# Patient Record
Sex: Female | Born: 1980 | Race: Asian | Hispanic: No | Marital: Single | Smoking: Never smoker
Health system: Southern US, Community
[De-identification: ages and names within clinical notes are randomized; demographics above are authoritative.]

## PROBLEM LIST (undated history)

## (undated) DIAGNOSIS — O133 Gestational [pregnancy-induced] hypertension without significant proteinuria, third trimester: Secondary | ICD-10-CM

## (undated) DIAGNOSIS — Z8759 Personal history of other complications of pregnancy, childbirth and the puerperium: Secondary | ICD-10-CM

## (undated) DIAGNOSIS — O139 Gestational [pregnancy-induced] hypertension without significant proteinuria, unspecified trimester: Secondary | ICD-10-CM

## (undated) HISTORY — DX: Gestational (pregnancy-induced) hypertension without significant proteinuria, unspecified trimester: O13.9

---

## 2014-11-22 ENCOUNTER — Ambulatory Visit (INDEPENDENT_AMBULATORY_CARE_PROVIDER_SITE_OTHER): Payer: PRIVATE HEALTH INSURANCE | Admitting: Family Medicine

## 2014-11-22 VITALS — BP 100/68 | HR 80 | Temp 98.4°F | Resp 16 | Ht 61.5 in | Wt 134.2 lb

## 2014-11-22 DIAGNOSIS — K5901 Slow transit constipation: Secondary | ICD-10-CM | POA: Diagnosis not present

## 2014-11-22 DIAGNOSIS — K648 Other hemorrhoids: Secondary | ICD-10-CM | POA: Diagnosis not present

## 2014-11-22 DIAGNOSIS — Z789 Other specified health status: Secondary | ICD-10-CM | POA: Diagnosis not present

## 2014-11-22 MED ORDER — HYDROCORTISONE ACE-PRAMOXINE 2.5-1 % RE CREA: 1.0000 "application " | TOPICAL_CREAM | Freq: Three times a day (TID) | RECTAL | Status: DC

## 2014-11-22 NOTE — Patient Instructions (Signed)
Use Analpram-HC cream 2.5% 2 or 3 times daily for hemorrhoids  Drink lots of water  Eat more fruits and vegetables  Take MiraLAX one dose daily for constipation. If necessary take one dose twice daily. If stools become too loose decrease to taking every other day.  If this does not get the bowels moving, you can purchase a bottle of magnesium citrate laxitive and drink one bottle.  If not improving in the next 2 weeks please return for a recheck. You can call the office and ask what they Dr. Alwyn RenHopper is working and if you come in when I'm in the office and request me I'll be happy to see you.  Return at any time if having abdominal pain or acutely worse.  To bn (Constipation) To bn l khi m?t ng??i ?i ??i ti?n t h?n ba l?n trong m?t tu?n, ??i ti?n kh kh?n, ho?c ??i ti?n ra phn kh, c?ng, ho?c to h?n bnh th??ng. Khi tu?i cng cao th cng d? b? to bn. N?u qu v? c? g?ng ch?a to bn b?ng cc lo?i thu?c gip qu v? ??i ti?n ???c (thu?c nhu?n trng), b?nh c th? n?ng thm. S? d?ng thu?c nhu?n trng trong th?i gian di c th? lm cho cc c? c?a ru?t gi y?u ?i. Ch? ?? ?n thi?u ch?t x?, khng u?ng ?? n??c, v dng m?t s? lo?i thu?c nh?t ??nh c th? lm to bo n?ng thm.  NGUYN NHN.   M?t s? lo?i thu?c nh?t ??nh nh? thu?c ch?ng tr?m c?m, thu?c gi?m ?au, thu?c c b? sung s?t, thu?c trung ha axit d?ch v?, v thu?c l?i ti?u.  M?t s? b?nh l nh?t ??nh nh? ti?u ???ng, h?i ch?ng ru?t kch thch (IBS), b?nh c?a tuy?n gip, ho?c tr?m c?m.  Khng u?ng ?? n??c.  Khng ?n ?? th?c ?n giu ch?t x?.  C?ng th?ng ho?c do ?i l?i.  t ho?t ??ng thn th? ho?c th? d?c.  Nh?n ?i ??i ti?n.  S? d?ng qu nhi?u thu?c nhu?n trng. D?U HI?U V TRI?U CH?NG   ?i ??i ti?n t h?n ba l?n m?i tu?n.  Ph?i r?n m?nh ?? ??i ti?n.  ??i ti?n ra phn c?ng, kh, ho?c to h?n bnh th??ng.  C?m th?y ??y b?ng ho?c ch??ng b?ng.  ?au ? vng b?ng d??i.  Khng c?m th?y tho?i mi sau khi ??i ti?n. CH?N ?ON   Chuyn gia ch?m [place] s?c kh?e c?a qu v? s? h?i v? b?nh s? v khm th?c th? cho qu v?. C th? c?n ki?m tra thm trong tr??ng h?p to bn n?ng. M?t s? ki?m tra c th? bao g?m:  Ch?p X quang c ch?t c?n quang ?? ki?m tra tr?c trng, ??i trng, v ?i khi c? ru?t non c?a qu v?.  N?i soi tr?c trng sigma ?? ki?m tra ph?n pha d??i c?a ??i trng.  Th? thu?t soi ??i trng ?? khm ton b? ??i trng. ?I?U TR?  ?i?u tr? ty thu?c [place] m?c ?? tr?m tr?ng c?a ch?ng to bn v nguyn nhn gy to bn. ?i?u tr? thng qua ch? ?? ?n u?ng bao g?m u?ng nhi?u n??c h?n v ?n th?c ?n c nhi?u ch?t x? h?n. ?i?u tr? thng qua l?i s?ng c th? bao g?m vi?c t?p th? d?c th??ng xuyn. N?u nh?ng khuy?n ngh? v? ch? ?? ?n v l?i s?ng khng c tc d?ng, chuyn gia ch?m [place] s?c kh?e c th? khuy?n ngh? qu v? dng cc lo?i thu?c nhu?n trng khng c?n k ??n ?? gip  qu v? ??i ti?n. C th? ph?i k ??n thu?c c?n k ??n n?u thu?c khng c?n k ??n khng c tc d?ng.  H??NG D?N CH?M [place] T?I NH   ?n th?c ?n c nhi?u ch?t x? nh? tri cy, rau, ng? c?c nguyn h?t, v cc lo?i ??u.  H?n ch? th?c ?n c nhi?u ch?t bo v ???ng ch? bi?n s?n, ch?ng h?n khoai ty chin, bnh hamburger, bnh quy, k?o, v soda.  C th? dng th?c ph?m ch?c n?ng c b? sung ch?t x? [place] kh?u ph?n ?n c?a qu v? n?u qu v? khng th? dng ?? ch?t x? t? th?c ?n.  U?ng ?? n??c ?? gi? cho n??c ti?u trong ho?c vng nh?t.  T?p th? d?c th??ng xuyn ho?c theo ch? d?n c?a chuyn gia ch?m [place] s?c kh?e.  [place] nh v? sinh ngay khi qu v? c nhu c?u. Khng nh?n ?i ??i ti?n.  Ch? s? d?ng thu?c khng c?n k ??n ho?c thu?c c?n k ??n theo ch? d?n c?a chuyn gia ch?m [place] s?c kh?e.Khng dng cc lo?i thu?c ch?ng to bn khc m khng bn b?c tr??c v?i chuyn gia ch?m [place] s?c kh?e. NGAY L?P T?C ?I KHM N?U:   ?i ??i ti?n ra mu ?? t??i.  Ch?ng to bn ko di h?n 4 ngy v tr?m tr?ng h?n.  Qu v? b? ?au b?ng ho?c ?au tr?c trng.  Phn c?a qu v? m?ng, trng nh? bt  ch.  Qu v? b? s?t cn khng r nguyn nhn. ??M B?O QU V?:   Hi?u r cc h??ng d?n ny.  S? theo di tnh tr?ng c?a mnh.  S? yu c?u tr? gip ngay l?p t?c n?u qu v? c?m th?y khng kh?e ho?c th?y tr?m tr?ng h?n. Document Released: 08/21/2010 Document Revised: 05/11/2013 [facility] Patient Information 2015 [place], [place]. This information is not intended to replace advice given to you by your health care provider. Make sure you discuss any questions you have with your health care provider.

## 2014-11-22 NOTE — Progress Notes (Signed)
Subjective:  Patient ID: Kathleen Bailey, female    DOB: Sep 20, 1980  Age: 34 y.o. MRN: [id number]  34 year old lady complaining of hemorrhoids. She has been in the [place] for 3 years, and was having hemorrhoid problems before she came here. She is single, has not had any children. She ate a lot of vegetables in [place] which did her better, but she doesn't like the ones here and has not eaten enough vegetables. She complains of the hemorrhoids coming out. Her aunt did help her purchased some Preparation H, which she has used though possibly not correctly. She still has bad constipation, with her bowels moving only about once a week. She works doing nails. She has been in the [place] for 3 years but has not learned hardly any [place]. She was encouraged to try to do so. Her aunt who is with her was able to easily interpret.  Reviewed past, family, social history Objective:  Healthy-appearing young lady in no major distress. Abdomen soft with some mild nonspecific tenderness. Anus appears grossly normal externally with no external hemorrhoids today. Digital exam reveals internal hemorrhoids palpable especially at about the 11:00 position. Anoscopy was not done.   Assessment & Plan:   Assessment: Internal hemorrhoids Constipation  Plan: Patient Instructions  Use Analpram-HC cream 2.5% 2 or 3 times daily for hemorrhoids  Drink lots of water  Eat more fruits and vegetables  Take MiraLAX one dose daily for constipation. If necessary take one dose twice daily. If stools become too loose decrease to taking every other day.  If this does not get the bowels moving, you can purchase a bottle of magnesium citrate laxitive and drink one bottle.  If not improving in the next 2 weeks please return for a recheck. You can call the office and ask what they Dr. Alwyn Ren is working and if you come in when I'm in the office and request me I'll be happy to see you.  Return at any time if having abdominal pain or  acutely worse.  To bn (Constipation) To bn l khi m?t ng??i ?i ??i ti?n t h?n ba l?n trong m?t tu?n, ??i ti?n kh kh?n, ho?c ??i ti?n ra phn kh, c?ng, ho?c to h?n bnh th??ng. Khi tu?i cng cao th cng d? b? to bn. N?u qu v? c? g?ng ch?a to bn b?ng cc lo?i thu?c gip qu v? ??i ti?n ???c (thu?c nhu?n trng), b?nh c th? n?ng thm. S? d?ng thu?c nhu?n trng trong th?i gian di c th? lm cho cc c? c?a ru?t gi y?u ?i. Ch? ?? ?n thi?u ch?t x?, khng u?ng ?? n??c, v dng m?t s? lo?i thu?c nh?t ??nh c th? lm to bo n?ng thm.  NGUYN NHN.   M?t s? lo?i thu?c nh?t ??nh nh? thu?c ch?ng tr?m c?m, thu?c gi?m ?au, thu?c c b? sung s?t, thu?c trung ha axit d?ch v?, v thu?c l?i ti?u.  M?t s? b?nh l nh?t ??nh nh? ti?u ???ng, h?i ch?ng ru?t kch thch (IBS), b?nh c?a tuy?n gip, ho?c tr?m c?m.  Khng u?ng ?? n??c.  Khng ?n ?? th?c ?n giu ch?t x?.  C?ng th?ng ho?c do ?i l?i.  t ho?t ??ng thn th? ho?c th? d?c.  Nh?n ?i ??i ti?n.  S? d?ng qu nhi?u thu?c nhu?n trng. D?U HI?U V TRI?U CH?NG   ?i ??i ti?n t h?n ba l?n m?i tu?n.  Ph?i r?n m?nh ?? ??i ti?n.  ??i ti?n ra phn c?ng, kh, ho?c to h?n bnh th??ng.  C?m th?y ??y b?ng ho?c ch??ng b?ng.  ?au ? vng b?ng d??i.  Khng c?m th?y tho?i mi sau khi ??i ti?n. CH?N ?ON  Chuyn gia ch?m [place] s?c kh?e c?a qu v? s? h?i v? b?nh s? v khm th?c th? cho qu v?. C th? c?n ki?m tra thm trong tr??ng h?p to bn n?ng. M?t s? ki?m tra c th? bao g?m:  Ch?p X quang c ch?t c?n quang ?? ki?m tra tr?c trng, ??i trng, v ?i khi c? ru?t non c?a qu v?.  N?i soi tr?c trng sigma ?? ki?m tra ph?n pha d??i c?a ??i trng.  Th? thu?t soi ??i trng ?? khm ton b? ??i trng. ?I?U TR?  ?i?u tr? ty thu?c [place] m?c ?? tr?m tr?ng c?a ch?ng to bn v nguyn nhn gy to bn. ?i?u tr? thng qua ch? ?? ?n u?ng bao g?m u?ng nhi?u n??c h?n v ?n th?c ?n c nhi?u ch?t x? h?n. ?i?u tr? thng qua l?i s?ng c th? bao g?m vi?c t?p th? d?c  th??ng xuyn. N?u nh?ng khuy?n ngh? v? ch? ?? ?n v l?i s?ng khng c tc d?ng, chuyn gia ch?m [place] s?c kh?e c th? khuy?n ngh? qu v? dng cc lo?i thu?c nhu?n trng khng c?n k ??n ?? gip qu v? ??i ti?n. C th? ph?i k ??n thu?c c?n k ??n n?u thu?c khng c?n k ??n khng c tc d?ng.  H??NG D?N CH?M [place] T?I NH   ?n th?c ?n c nhi?u ch?t x? nh? tri cy, rau, ng? c?c nguyn h?t, v cc lo?i ??u.  H?n ch? th?c ?n c nhi?u ch?t bo v ???ng ch? bi?n s?n, ch?ng h?n khoai ty chin, bnh hamburger, bnh quy, k?o, v soda.  C th? dng th?c ph?m ch?c n?ng c b? sung ch?t x? [place] kh?u ph?n ?n c?a qu v? n?u qu v? khng th? dng ?? ch?t x? t? th?c ?n.  U?ng ?? n??c ?? gi? cho n??c ti?u trong ho?c vng nh?t.  T?p th? d?c th??ng xuyn ho?c theo ch? d?n c?a chuyn gia ch?m [place] s?c kh?e.  [place] nh v? sinh ngay khi qu v? c nhu c?u. Khng nh?n ?i ??i ti?n.  Ch? s? d?ng thu?c khng c?n k ??n ho?c thu?c c?n k ??n theo ch? d?n c?a chuyn gia ch?m [place] s?c kh?e.Khng dng cc lo?i thu?c ch?ng to bn khc m khng bn b?c tr??c v?i chuyn gia ch?m [place] s?c kh?e. NGAY L?P T?C ?I KHM N?U:   ?i ??i ti?n ra mu ?? t??i.  Ch?ng to bn ko di h?n 4 ngy v tr?m tr?ng h?n.  Qu v? b? ?au b?ng ho?c ?au tr?c trng.  Phn c?a qu v? m?ng, trng nh? bt ch.  Qu v? b? s?t cn khng r nguyn nhn. ??M B?O QU V?:   Hi?u r cc h??ng d?n ny.  S? theo di tnh tr?ng c?a mnh.  S? yu c?u tr? gip ngay l?p t?c n?u qu v? c?m th?y khng kh?e ho?c th?y tr?m tr?ng h?n. Document Released: 08/21/2010 Document Revised: 05/11/2013 [facility] Patient Information 2015 [place], [place]. This information is not intended to replace advice given to you by your health care provider. Make sure you discuss any questions you have with your health care provider.      Marshell Rieger, MD 11/22/2014

## 2014-12-06 ENCOUNTER — Ambulatory Visit (INDEPENDENT_AMBULATORY_CARE_PROVIDER_SITE_OTHER): Payer: No Typology Code available for payment source | Admitting: Family Medicine

## 2014-12-06 VITALS — BP 128/72 | HR 90 | Temp 98.3°F | Resp 16 | Ht 61.0 in | Wt 137.4 lb

## 2014-12-06 DIAGNOSIS — K5901 Slow transit constipation: Secondary | ICD-10-CM | POA: Diagnosis not present

## 2014-12-06 DIAGNOSIS — K6289 Other specified diseases of anus and rectum: Secondary | ICD-10-CM | POA: Diagnosis not present

## 2014-12-06 DIAGNOSIS — K648 Other hemorrhoids: Secondary | ICD-10-CM

## 2014-12-06 NOTE — Progress Notes (Signed)
  Subjective:  Patient ID: Kathleen Bailey, female    DOB: 17-Nov-1980  Age: 34 y.o. MRN: [id number]  Patient is here for follow-up with regard to her anal pain. Still hurts up inside of her some. She is doing better. Her bowels are moving better with the MiraLAX. No bleeding. No nausea or vomiting. No major abdominal pain. She has an interpreter with her.   Objective:   Abdomen is soft. Anus appears normal. However when up hard she complains of pain. On digital exam I could feel an internal hemorrhoid at about the 1:00 position.  Procedure note: The endoscope was used to examine the anus. It was inserted without difficulty. Good exam was obtained. She had a small internal hemorrhoid but no other major lesions. She tolerated the procedure well. No significant inflammation.  Assessment & Plan:   Assessment: Anal pain Internal hemorrhoids Constipation, improved  Plan: There are no Patient Instructions on file for this visit.   HOPPER,DAVID, MD 12/06/2014

## 2014-12-06 NOTE — Patient Instructions (Signed)
Continue using the MiraLAX. If the bowels are doing well she can decrease the frequency or amount of the medicine she is using. However she should not let her self get constipated, and if she goes a couple of days without a bowel movement she should make sure she is using it.  Continue using hemorrhoid cream for a couple more weeks.  If problems continue to persist please return

## 2014-12-06 NOTE — Addendum Note (Signed)
Addended by: Maurene CapesPOTTS, Lain Tetterton M on: 12/06/2014 12:54 PM   Modules accepted: Level of Service

## 2015-02-15 ENCOUNTER — Telehealth: Payer: Self-pay

## 2015-02-15 NOTE — Telephone Encounter (Signed)
Patient would like a referral to a specialist who can evaluate and treat a bone spur on her heel.  Please advise, thank you.  CB#: [phone]

## 2015-02-16 NOTE — Telephone Encounter (Signed)
Pt has to been seen here first.

## 2015-02-17 NOTE — Telephone Encounter (Signed)
Left message letting pt know to RTC.

## 2015-02-20 ENCOUNTER — Ambulatory Visit (INDEPENDENT_AMBULATORY_CARE_PROVIDER_SITE_OTHER): Payer: No Typology Code available for payment source | Admitting: Family Medicine

## 2015-02-20 VITALS — BP 110/68 | HR 97 | Temp 98.0°F | Resp 16 | Ht 61.25 in | Wt 143.0 lb

## 2015-02-20 DIAGNOSIS — M79671 Pain in right foot: Secondary | ICD-10-CM

## 2015-02-20 DIAGNOSIS — E890 Postprocedural hypothyroidism: Secondary | ICD-10-CM

## 2015-02-20 DIAGNOSIS — Z789 Other specified health status: Secondary | ICD-10-CM | POA: Diagnosis not present

## 2015-02-20 DIAGNOSIS — M722 Plantar fascial fibromatosis: Secondary | ICD-10-CM | POA: Diagnosis not present

## 2015-02-20 DIAGNOSIS — G8929 Other chronic pain: Secondary | ICD-10-CM

## 2015-02-20 DIAGNOSIS — R1310 Dysphagia, unspecified: Secondary | ICD-10-CM | POA: Diagnosis not present

## 2015-02-20 DIAGNOSIS — Z9009 Acquired absence of other part of head and neck: Secondary | ICD-10-CM

## 2015-02-20 DIAGNOSIS — M542 Cervicalgia: Secondary | ICD-10-CM | POA: Diagnosis not present

## 2015-02-20 DIAGNOSIS — Z87898 Personal history of other specified conditions: Secondary | ICD-10-CM

## 2015-02-20 LAB — POCT CBC
Granulocyte percent: 73.7 %G (ref 37–80)
HCT, POC: 43.4 % (ref 37.7–47.9)
Hemoglobin: 13.3 g/dL (ref 12.2–16.2)
LYMPH, POC: 2.5 (ref 0.6–3.4)
MCH, POC: 26.7 pg — AB (ref 27–31.2)
MCHC: 30.6 g/dL — AB (ref 31.8–35.4)
MCV: 87.5 fL (ref 80–97)
MID (CBC): 0.3 (ref 0–0.9)
MPV: 8.6 fL (ref 0–99.8)
PLATELET COUNT, POC: 191 10*3/uL (ref 142–424)
POC Granulocyte: 8 — AB (ref 2–6.9)
POC LYMPH PERCENT: 23.2 %L (ref 10–50)
POC MID %: 3.1 % (ref 0–12)
RBC: 4.96 M/uL (ref 4.04–5.48)
RDW, POC: 13 %
WBC: 10.8 10*3/uL — AB (ref 4.6–10.2)

## 2015-02-20 MED ORDER — DICLOFENAC SODIUM 75 MG PO TBEC: 75.0000 mg | DELAYED_RELEASE_TABLET | Freq: Two times a day (BID) | ORAL | Status: DC

## 2015-02-20 NOTE — Progress Notes (Signed)
Patient ID: Kathleen Bailey, female    DOB: [birth year]  Age: 34 y.o. MRN: [id number]  Chief Complaint  Patient presents with  . Foot Pain    Pain in both heels onset 4 years on and off/ worse in right foot  . Throat swelling    After thyroidectomy in [place] 1 year ago    Subjective:   34 year old lady who does not speak [place], but has a [place] interpreter with her. She has a couple of things going on today. She was in [place] last year and had a thyroid surgery, with the thyroid removed apparently. She has persisted with having throat pain and pain with swallowing. She had an episode a few weeks ago of a high fever over 100. She is mostly concerned because of the discomfort that she has had there when she swallows. She did everything that the doctor told her to there.  Her other main concern is she she's been having a lot of trouble with pain in the right heel. This been going on for about a year, it is worse now. It hurts in the whole heel area, but especially at the base of the calcaneus. No known trauma. She usually wears flats and flip-flops at work. 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 When she gets up in the morning and her feet hit the floor but hurt considerably, especially right.  Current allergies, medications, problem list, past/family and social histories reviewed.  Objective:  BP 110/68 mmHg  Pulse 97  Temp(Src) 98 F (36.7 C) (Oral)  Resp 16  Ht 5' 1.25" (1.556 m)  Wt 143 lb (64.864 kg)  BMI 26.79 kg/m2  SpO2 97%  LMP 01/23/2015  No major acute distress. Very faint scar is visible from the thyroidectomy. Neck is supple without significant nodes. Throat clear. No thyromegaly. Her right heel is tender, but especially at the anterior aspect of the base of the calcaneus. Full range of motion of ankle.  Assessment & Plan:   Assessment: 1. Plantar fasciitis, right   2. Heel pain, chronic, right   3. Cervical pain   4. Swallowing pain   5. History of thyroidectomy     6. History of fever   7. Language barrier       Plan: See instructions. Need to see whether she needs thyroid hormone replacement.  Orders Placed This Encounter  Procedures  . TSH  . POCT CBC    Meds ordered this encounter  Medications  . diclofenac (VOLTAREN) 75 MG EC tablet    Sig: Take 1 tablet (75 mg total) by mouth 2 (two) times daily.    Dispense:  30 tablet    Refill:  1         Patient Instructions  Take the diclofenac one twice daily for the heel pain. It is a strong medicine for inflammation, and should help the throat and neck also.  If she gets to running fever with her throat she should come in and get checked at that time.  Do the heel stretching exercises as explained. Read the handout below.  Wear comfortable shoes. Also consider getting a heel cup insert from the foot appliances counter at the drugstore.   Return if not improving  Plantar Fasciitis Plantar fasciitis is a common condition that causes foot pain. It is soreness (inflammation) of the band of tough fibrous tissue on the bottom of the foot that runs from the heel bone (calcaneus) to the ball of the foot.  The cause of this soreness may be from excessive standing, poor fitting shoes, running on hard surfaces, being overweight, having an abnormal walk, or overuse (this is common in runners) of the painful foot or feet. It is also common in aerobic exercise dancers and ballet dancers. SYMPTOMS  Most people with plantar fasciitis complain of:  Severe pain in the morning on the bottom of their foot especially when taking the first steps out of bed. This pain recedes after a few minutes of walking.  Severe pain is experienced also during walking following a long period of inactivity.  Pain is worse when walking barefoot or up stairs DIAGNOSIS   Your caregiver will diagnose this condition by examining and feeling your foot.  Special tests such as X-rays of your foot, are usually not  needed. PREVENTION   Consult a sports medicine professional before beginning a new exercise program.  Walking programs offer a good workout. With walking there is a lower chance of overuse injuries common to runners. There is less impact and less jarring of the joints.  Begin all new exercise programs slowly. If problems or pain develop, decrease the amount of time or distance until you are at a comfortable level.  Wear good shoes and replace them regularly.  Stretch your foot and the heel cords at the back of the ankle (Achilles tendon) both before and after exercise.  Run or exercise on even surfaces that are not hard. For example, asphalt is better than pavement.  Do not run barefoot on hard surfaces.  If using a treadmill, vary the incline.  Do not continue to workout if you have foot or joint problems. Seek professional help if they do not improve. HOME CARE INSTRUCTIONS   Avoid activities that cause you pain until you recover.  Use ice or cold packs on the problem or painful areas after working out.  Only take over-the-counter or prescription medicines for pain, discomfort, or fever as directed by your caregiver.  Soft shoe inserts or athletic shoes with air or gel sole cushions may be helpful.  If problems continue or become more severe, consult a sports medicine caregiver or your own health care provider. Cortisone is a potent anti-inflammatory medication that may be injected into the painful area. You can discuss this treatment with your caregiver. MAKE SURE YOU:   Understand these instructions.  Will watch your condition.  Will get help right away if you are not doing well or get worse. Document Released: 01/29/2001 Document Revised: 07/29/2011 Document Reviewed: 03/30/2008 [facility] Patient Information 2015 [place], [place]. This information is not intended to replace advice given to you by your health care provider. Make sure you discuss any questions you have with  your health care provider.       No Follow-up on file.   Robb Sibal, MD 02/20/2015

## 2015-02-20 NOTE — Patient Instructions (Addendum)
Take the diclofenac one twice daily for the heel pain. It is a strong medicine for inflammation, and should help the throat and neck also.  If she gets to running fever with her throat she should come in and get checked at that time.  Do the heel stretching exercises as explained. Read the handout below.  Wear comfortable shoes. Also consider getting a heel cup insert from the foot appliances counter at the drugstore.   Return if not improving  Plantar Fasciitis Plantar fasciitis is a common condition that causes foot pain. It is soreness (inflammation) of the band of tough fibrous tissue on the bottom of the foot that runs from the heel bone (calcaneus) to the ball of the foot. The cause of this soreness may be from excessive standing, poor fitting shoes, running on hard surfaces, being overweight, having an abnormal walk, or overuse (this is common in runners) of the painful foot or feet. It is also common in aerobic exercise dancers and ballet dancers. SYMPTOMS  Most people with plantar fasciitis complain of:  Severe pain in the morning on the bottom of their foot especially when taking the first steps out of bed. This pain recedes after a few minutes of walking.  Severe pain is experienced also during walking following a long period of inactivity.  Pain is worse when walking barefoot or up stairs DIAGNOSIS   Your caregiver will diagnose this condition by examining and feeling your foot.  Special tests such as X-rays of your foot, are usually not needed. PREVENTION   Consult a sports medicine professional before beginning a new exercise program.  Walking programs offer a good workout. With walking there is a lower chance of overuse injuries common to runners. There is less impact and less jarring of the joints.  Begin all new exercise programs slowly. If problems or pain develop, decrease the amount of time or distance until you are at a comfortable level.  Wear good shoes and  replace them regularly.  Stretch your foot and the heel cords at the back of the ankle (Achilles tendon) both before and after exercise.  Run or exercise on even surfaces that are not hard. For example, asphalt is better than pavement.  Do not run barefoot on hard surfaces.  If using a treadmill, vary the incline.  Do not continue to workout if you have foot or joint problems. Seek professional help if they do not improve. HOME CARE INSTRUCTIONS   Avoid activities that cause you pain until you recover.  Use ice or cold packs on the problem or painful areas after working out.  Only take over-the-counter or prescription medicines for pain, discomfort, or fever as directed by your caregiver.  Soft shoe inserts or athletic shoes with air or gel sole cushions may be helpful.  If problems continue or become more severe, consult a sports medicine caregiver or your own health care provider. Cortisone is a potent anti-inflammatory medication that may be injected into the painful area. You can discuss this treatment with your caregiver. MAKE SURE YOU:   Understand these instructions.  Will watch your condition.  Will get help right away if you are not doing well or get worse. Document Released: 01/29/2001 Document Revised: 07/29/2011 Document Reviewed: 03/30/2008 [facility] Patient Information 2015 [place], [place]. This information is not intended to replace advice given to you by your health care provider. Make sure you discuss any questions you have with your health care provider.

## 2015-02-21 LAB — TSH: TSH: 1.205 u[IU]/mL (ref 0.350–4.500)

## 2017-05-20 NOTE — L&D Delivery Note (Signed)
Operative Delivery Note Pt pushed for 1.5 hours and c/o exhaustion.  At 2:43 PM a viable and healthy female was delivered via Vaginal, Spontaneous.  Presentation: vertex; Position: Occiput,, Anterior; Station: +3.  Verbal consent: obtained from patient and family.  Risks and benefits discussed in detail.  Risks include, but are not limited to the risks of anesthesia, bleeding, infection, damage to maternal tissues, fetal cephalhematoma.  There is also the risk of inability to effect vaginal delivery of the head, or shoulder dystocia that cannot be resolved by established maneuvers, leading to the need for emergency cesarean section.  APGAR: 7, 9; weight 7 lb 1 oz (3202 g).   Placenta status: delivered, intact .   Cord: 3V with the following complications: none.  Anesthesia: epidural  Instruments: [place] Episiotomy: None Lacerations: B Labial;2nd degree;Perineal Suture Repair: 3.0 vicryl rapide Est. Blood Loss (mL):  300cc  Mom to postpartum.  Baby to Couplet care / Skin to Skin.  Kathleen Bailey 01/24/2018, 3:19 PM  Br/Bo; [place], Tdap in [facility]; Contra?/A+

## 2017-07-15 LAB — OB RESULTS CONSOLE ABO/RH: RH TYPE: POSITIVE

## 2017-07-15 LAB — OB RESULTS CONSOLE ANTIBODY SCREEN: ANTIBODY SCREEN: NEGATIVE

## 2017-07-15 LAB — OB RESULTS CONSOLE RPR: RPR: NONREACTIVE

## 2017-07-15 LAB — OB RESULTS CONSOLE HEPATITIS B SURFACE ANTIGEN: Hepatitis B Surface Ag: NEGATIVE

## 2017-07-15 LAB — OB RESULTS CONSOLE RUBELLA ANTIBODY, IGM: Rubella: IMMUNE

## 2017-07-15 LAB — OB RESULTS CONSOLE GC/CHLAMYDIA
CHLAMYDIA, DNA PROBE: NEGATIVE
Gonorrhea: NEGATIVE

## 2017-07-15 LAB — OB RESULTS CONSOLE HIV ANTIBODY (ROUTINE TESTING): HIV: NONREACTIVE

## 2017-07-22 ENCOUNTER — Encounter: Payer: Self-pay | Admitting: Family Medicine

## 2018-01-13 LAB — OB RESULTS CONSOLE GBS: GBS: NEGATIVE

## 2018-01-21 ENCOUNTER — Encounter (HOSPITAL_COMMUNITY): Payer: Self-pay | Admitting: *Deleted

## 2018-01-21 ENCOUNTER — Telehealth (HOSPITAL_COMMUNITY): Payer: Self-pay | Admitting: *Deleted

## 2018-01-21 NOTE — Telephone Encounter (Signed)
Preadmission screen  

## 2018-01-23 ENCOUNTER — Inpatient Hospital Stay (HOSPITAL_COMMUNITY): Payer: BLUE CROSS/BLUE SHIELD | Admitting: Anesthesiology

## 2018-01-23 ENCOUNTER — Inpatient Hospital Stay (HOSPITAL_COMMUNITY)
Admission: AD | Admit: 2018-01-23 | Discharge: 2018-01-26 | DRG: 806 | Disposition: A | Discharge status: Home / Self Care | Payer: BLUE CROSS/BLUE SHIELD | Attending: Obstetrics and Gynecology | Admitting: Obstetrics and Gynecology

## 2018-01-23 ENCOUNTER — Other Ambulatory Visit: Payer: Self-pay

## 2018-01-23 ENCOUNTER — Encounter (HOSPITAL_COMMUNITY): Payer: Self-pay | Admitting: Obstetrics and Gynecology

## 2018-01-23 DIAGNOSIS — O134 Gestational [pregnancy-induced] hypertension without significant proteinuria, complicating childbirth: Secondary | ICD-10-CM | POA: Diagnosis present

## 2018-01-23 DIAGNOSIS — D6959 Other secondary thrombocytopenia: Secondary | ICD-10-CM | POA: Diagnosis present

## 2018-01-23 DIAGNOSIS — O133 Gestational [pregnancy-induced] hypertension without significant proteinuria, third trimester: Secondary | ICD-10-CM

## 2018-01-23 DIAGNOSIS — O1404 Mild to moderate pre-eclampsia, complicating childbirth: Secondary | ICD-10-CM | POA: Diagnosis present

## 2018-01-23 DIAGNOSIS — Z3A37 37 weeks gestation of pregnancy: Secondary | ICD-10-CM

## 2018-01-23 DIAGNOSIS — Z8759 Personal history of other complications of pregnancy, childbirth and the puerperium: Secondary | ICD-10-CM

## 2018-01-23 DIAGNOSIS — O9912 Other diseases of the blood and blood-forming organs and certain disorders involving the immune mechanism complicating childbirth: Secondary | ICD-10-CM | POA: Diagnosis present

## 2018-01-23 HISTORY — DX: Gestational (pregnancy-induced) hypertension without significant proteinuria, third trimester: O13.3

## 2018-01-23 HISTORY — DX: Personal history of other complications of pregnancy, childbirth and the puerperium: Z87.59

## 2018-01-23 LAB — CBC
HCT: 39.5 % (ref 36.0–46.0)
Hemoglobin: 13 g/dL (ref 12.0–15.0)
MCH: 30.6 pg (ref 26.0–34.0)
MCHC: 32.9 g/dL (ref 30.0–36.0)
MCV: 92.9 fL (ref 78.0–100.0)
PLATELETS: 97 10*3/uL — AB (ref 150–400)
RBC: 4.25 MIL/uL (ref 3.87–5.11)
RDW: 14.6 % (ref 11.5–15.5)
WBC: 9.3 10*3/uL (ref 4.0–10.5)

## 2018-01-23 LAB — COMPREHENSIVE METABOLIC PANEL
ALT: 12 U/L (ref 0–44)
AST: 26 U/L (ref 15–41)
Albumin: 2.6 g/dL — ABNORMAL LOW (ref 3.5–5.0)
Alkaline Phosphatase: 216 U/L — ABNORMAL HIGH (ref 38–126)
Anion gap: 9 (ref 5–15)
BUN: 7 mg/dL (ref 6–20)
CHLORIDE: 105 mmol/L (ref 98–111)
CO2: 19 mmol/L — AB (ref 22–32)
CREATININE: 0.58 mg/dL (ref 0.44–1.00)
Calcium: 8.5 mg/dL — ABNORMAL LOW (ref 8.9–10.3)
GFR calc Af Amer: >60 mL/min (ref >60)
GFR calc non Af Amer: >60 mL/min (ref >60)
Glucose, Bld: 69 mg/dL — ABNORMAL LOW (ref 70–99)
POTASSIUM: 4.4 mmol/L (ref 3.5–5.1)
Sodium: 133 mmol/L — ABNORMAL LOW (ref 135–145)
Total Bilirubin: 0.5 mg/dL (ref 0.3–1.2)
Total Protein: 6.5 g/dL (ref 6.5–8.1)

## 2018-01-23 LAB — TYPE AND SCREEN
ABO/RH(D): A POS
Antibody Screen: NEGATIVE

## 2018-01-23 LAB — ABO/RH: ABO/RH(D): A POS

## 2018-01-23 MED ORDER — OXYCODONE-ACETAMINOPHEN 5-325 MG PO TABS: 2.0000 | ORAL_TABLET | ORAL | Status: DC | PRN

## 2018-01-23 MED ORDER — OXYTOCIN 40 UNITS IN LACTATED RINGERS INFUSION - SIMPLE MED: 2.5000 [IU]/h | INTRAVENOUS | Status: DC

## 2018-01-23 MED ORDER — SOD CITRATE-CITRIC ACID 500-334 MG/5ML PO SOLN
30.0000 mL | ORAL | Status: DC | PRN
  Filled 2018-01-23: qty 15

## 2018-01-23 MED ORDER — BUTORPHANOL TARTRATE 1 MG/ML IJ SOLN: 1.0000 mg | INTRAMUSCULAR | Status: DC | PRN

## 2018-01-23 MED ORDER — PHENYLEPHRINE 40 MCG/ML (10ML) SYRINGE FOR IV PUSH (FOR BLOOD PRESSURE SUPPORT)
80.0000 ug | PREFILLED_SYRINGE | INTRAVENOUS | Status: DC | PRN
  Filled 2018-01-23: qty 5

## 2018-01-23 MED ORDER — HYDRALAZINE HCL 20 MG/ML IJ SOLN: 10.0000 mg | INTRAMUSCULAR | Status: DC | PRN

## 2018-01-23 MED ORDER — LACTATED RINGERS IV SOLN: 500.0000 mL | INTRAVENOUS | Status: DC | PRN

## 2018-01-23 MED ORDER — FLEET ENEMA 7-19 GM/118ML RE ENEM: 1.0000 | ENEMA | RECTAL | Status: DC | PRN

## 2018-01-23 MED ORDER — ACETAMINOPHEN 325 MG PO TABS
650.0000 mg | ORAL_TABLET | ORAL | Status: DC | PRN
  Filled 2018-01-23: qty 2

## 2018-01-23 MED ORDER — LACTATED RINGERS IV SOLN
INTRAVENOUS | Status: DC
  Administered 2018-01-23 (×2): via INTRAVENOUS

## 2018-01-23 MED ORDER — DIPHENHYDRAMINE HCL 50 MG/ML IJ SOLN: 12.5000 mg | INTRAMUSCULAR | Status: DC | PRN

## 2018-01-23 MED ORDER — OXYCODONE-ACETAMINOPHEN 5-325 MG PO TABS: 1.0000 | ORAL_TABLET | ORAL | Status: DC | PRN

## 2018-01-23 MED ORDER — LABETALOL HCL 5 MG/ML IV SOLN: 40.0000 mg | INTRAVENOUS | Status: DC | PRN

## 2018-01-23 MED ORDER — OXYTOCIN 40 UNITS IN LACTATED RINGERS INFUSION - SIMPLE MED
1.0000 m[IU]/min | INTRAVENOUS | Status: DC
  Administered 2018-01-23: 2 m[IU]/min via INTRAVENOUS
  Administered 2018-01-23: 4 m[IU]/min via INTRAVENOUS
  Filled 2018-01-23: qty 1000

## 2018-01-23 MED ORDER — FENTANYL 2.5 MCG/ML BUPIVACAINE 1/10 % EPIDURAL INFUSION (WH - ANES)
14.0000 mL/h | INTRAMUSCULAR | Status: DC | PRN
  Administered 2018-01-23 – 2018-01-24 (×4): 14 mL/h via EPIDURAL
  Filled 2018-01-23 (×4): qty 100

## 2018-01-23 MED ORDER — EPHEDRINE 5 MG/ML INJ
10.0000 mg | INTRAVENOUS | Status: DC | PRN
  Filled 2018-01-23: qty 2

## 2018-01-23 MED ORDER — LACTATED RINGERS IV SOLN: 500.0000 mL | Freq: Once | INTRAVENOUS | Status: DC

## 2018-01-23 MED ORDER — LIDOCAINE HCL (PF) 1 % IJ SOLN
30.0000 mL | INTRAMUSCULAR | Status: DC | PRN
  Filled 2018-01-23: qty 30

## 2018-01-23 MED ORDER — ONDANSETRON HCL 4 MG/2ML IJ SOLN: 4.0000 mg | Freq: Four times a day (QID) | INTRAMUSCULAR | Status: DC | PRN

## 2018-01-23 MED ORDER — LABETALOL HCL 5 MG/ML IV SOLN: 80.0000 mg | INTRAVENOUS | Status: DC | PRN

## 2018-01-23 MED ORDER — LABETALOL HCL 5 MG/ML IV SOLN
20.0000 mg | INTRAVENOUS | Status: DC | PRN
  Administered 2018-01-24: 20 mg via INTRAVENOUS
  Filled 2018-01-23: qty 4

## 2018-01-23 MED ORDER — TERBUTALINE SULFATE 1 MG/ML IJ SOLN
0.2500 mg | Freq: Once | INTRAMUSCULAR | Status: DC | PRN
  Filled 2018-01-23: qty 1

## 2018-01-23 MED ORDER — PHENYLEPHRINE 40 MCG/ML (10ML) SYRINGE FOR IV PUSH (FOR BLOOD PRESSURE SUPPORT)
80.0000 ug | PREFILLED_SYRINGE | INTRAVENOUS | Status: DC | PRN
  Filled 2018-01-23: qty 10
  Filled 2018-01-23: qty 5

## 2018-01-23 MED ORDER — OXYTOCIN BOLUS FROM INFUSION
500.0000 mL | Freq: Once | INTRAVENOUS | Status: AC
  Administered 2018-01-24: 500 mL via INTRAVENOUS

## 2018-01-23 MED ORDER — LIDOCAINE HCL (PF) 1 % IJ SOLN
INTRAMUSCULAR | Status: DC | PRN
  Administered 2018-01-23: 13 mL via EPIDURAL

## 2018-01-23 NOTE — Anesthesia Preprocedure Evaluation (Signed)
Anesthesia Evaluation  Patient identified by MRN, date of birth, ID band Patient awake    Reviewed: Allergy & Precautions, NPO status , Patient's Chart, lab work & pertinent test results  Airway Mallampati: II  TM Distance: >3 FB Neck ROM: Full    Dental no notable dental hx.    Pulmonary neg pulmonary ROS,    Pulmonary exam normal breath sounds clear to auscultation       Cardiovascular hypertension, negative cardio ROS Normal cardiovascular exam Rhythm:Regular Rate:Normal     Neuro/Psych negative neurological ROS  negative psych ROS   GI/Hepatic negative GI ROS, Neg liver ROS,   Endo/Other  negative endocrine ROS  Renal/GU negative Renal ROS  negative genitourinary   Musculoskeletal negative musculoskeletal ROS (+)   Abdominal   Peds negative pediatric ROS (+)  Hematology negative hematology ROS (+)   Anesthesia Other Findings   Reproductive/Obstetrics negative OB ROS (+) Pregnancy                             Anesthesia Physical Anesthesia Plan  ASA: II  Anesthesia Plan: Epidural   Post-op Pain Management:    Induction:   PONV Risk Score and Plan:   Airway Management Planned:   Additional Equipment:   Intra-op Plan:   Post-operative Plan:   Informed Consent:   Plan Discussed with:   Anesthesia Plan Comments:         Anesthesia Quick Evaluation  

## 2018-01-23 NOTE — Anesthesia Procedure Notes (Signed)
Epidural Patient location during procedure: OB Start time: 01/23/2018 1:24 PM End time: 01/23/2018 1:38 PM  Staffing Anesthesiologist: Lowella Curb, MD Performed: anesthesiologist   Preanesthetic Checklist Completed: patient identified, site marked, surgical consent, pre-op evaluation, timeout performed, IV checked, risks and benefits discussed and monitors and equipment checked  Epidural Patient position: sitting Prep: ChloraPrep Patient monitoring: heart rate, cardiac monitor, continuous pulse ox and blood pressure Approach: midline Location: L2-L3 Injection technique: LOR saline  Needle:  Needle type: Tuohy  Needle gauge: 17 G Needle length: 9 cm Needle insertion depth: 6 cm Catheter type: closed end flexible Catheter size: 20 Guage Catheter at skin depth: 10 cm Test dose: negative  Assessment Events: blood not aspirated, injection not painful, no injection resistance, negative IV test and no paresthesia  Additional Notes Reason for block:procedure for pain

## 2018-01-23 NOTE — Progress Notes (Signed)
Patient ID: Kathleen Bailey, female   DOB: July 08, 1980, 37 y.o.   MRN: [id number]   Comfortable with epidural  W/ interpreter d/w pt ROM AF VSS (140-150's/80's) gen NAD FTHs 130-135, mod var, + accel, + scalp stim, category 1 toco q 2-8min  AROM for bloody clear fluid,  4.5/30/-2  Foley bulb out, cont IOL w pitocin

## 2018-01-23 NOTE — H&P (Signed)
Kathleen Bailey is a 37 y.o. female G1P0 at 51+ with PIH/PreE and low platelets/?HELLP vs thrombocytopenic.  Pr/Cr ratio in pregnancy 300+  Pt also AMA.  EDC by LMP.   OB History    Gravida  1   Para      Term      Preterm      AB      Living        SAB      TAB      Ectopic      Multiple      Live Births            G1 present No abn pap No STD  Past Medical History:  Diagnosis Date  . PIH (pregnancy induced hypertension), third trimester 01/23/2018  . Pregnancy induced hypertension    PSH: none Family History: family history includes Hyperkalemia in her mother; Hypertension (age of onset: 54) in her maternal grandmother; Stroke in her mother. Social History:  reports that she has never smoked. She has never used smokeless tobacco. Her alcohol and drug histories are not on file.  Meds PNV ALL NKDA     Maternal Diabetes: No Genetic Screening: Normal Maternal Ultrasounds/Referrals: Normal Fetal Ultrasounds or other Referrals:  None Maternal Substance Abuse:  No Significant Maternal Medications:  None Significant Maternal Lab Results:  Lab values include: Group B Strep negative Other Comments:  None  Review of Systems  Constitutional: Negative.   HENT: Negative.   Eyes: Negative.   Respiratory: Negative.   Cardiovascular: Negative.   Gastrointestinal: Negative.   Genitourinary: Negative.   Musculoskeletal: Negative.   Skin: Negative.   Neurological: Negative.   Psychiatric/Behavioral: Negative.    Maternal Medical History:  Fetal activity: Perceived fetal activity is normal.    Prenatal complications: Pre-eclampsia.   Low platelets/ ?HELLP  Prenatal Complications - Diabetes: none.      There were no vitals taken for this visit. Maternal Exam:  Uterine Assessment: Contraction strength is mild.  Contraction frequency is regular.   Abdomen: Patient reports no abdominal tenderness. Fundal height is appropriate for gestation.   Estimated fetal  weight is 6.5-7#.   Fetal presentation: vertex  Introitus: Normal vulva. Normal vagina.  Cervix: Cervix evaluated by digital exam.     Physical Exam  Constitutional: She is oriented to person, place, and time. She appears well-developed and well-nourished.  HENT:  Head: Normocephalic and atraumatic.  Cardiovascular: Normal rate and regular rhythm.  Respiratory: Effort normal and breath sounds normal. No respiratory distress. She has no wheezes.  GI: Soft. Bowel sounds are normal. She exhibits no distension. There is no tenderness.  Musculoskeletal: Normal range of motion.  Neurological: She is alert and oriented to person, place, and time.  Skin: Skin is warm and dry.  Psychiatric: She has a normal mood and affect. Her behavior is normal.    Prenatal labs: ABO, Rh: A/Positive/-- (02/26 0000) Antibody: Negative (02/26 0000) Rubella: Immune (02/26 0000) RPR: Nonreactive (02/26 0000)  HBsAg: Negative (02/26 0000)  HIV: Non-reactive (02/26 0000)  GBS: Negative (08/27 0000)   Flu and Tdap in [facility]  Hgb 13.7/Plt 193/Ur Cx neg/ GC neg/Chl neg/Varicella immune/Hgb electro WNL/Pap WNL/First Trimester WNL/glucola 128/  Nl anat, female   Assessment/Plan: 37yo G1 at 37+ with PIH/AMA for IOL also low plts Foley bulb and pitocin for IOL AROM when able Epidural if able Expect SVD Poss MG   Nuriyah Hanline Bovard-Stuckert 01/23/2018, 11:25 AM

## 2018-01-23 NOTE — Progress Notes (Signed)
Interpreter Phylliss Bob #[phone] on the line for shift assessment from 1926-1950.

## 2018-01-23 NOTE — Progress Notes (Signed)
[place] Interpreter, [place] [phone], on the line for SVE

## 2018-01-23 NOTE — Anesthesia Pain Management Evaluation Note (Signed)
  CRNA Pain Management Visit Note  Patient: Kathleen Bailey, 37 y.o., female  "Hello I am a member of the anesthesia team at [facility]. We have an anesthesia team available at all times to provide care throughout the hospital, including epidural management and anesthesia for C-section. I don't know your plan for the delivery whether it a natural birth, water birth, IV sedation, nitrous supplementation, doula or epidural, but we want to meet your pain goals."   1.Was your pain managed to your expectations on prior hospitalizations?   Unable to assess - patient sleeping  2.What is your expectation for pain management during this hospitalization?     Epidural  3.How can we help you reach that goal? Support prn  Record the patient's initial score and the patient's pain goal.   Pain: 0  Pain Goal: 3   Assessment based on bedside RN's assessment 5 min prior to CRNA arrival.  Language barrier. The [facility] wants you to be able to say your pain was always managed very well.  [facility] 01/23/2018

## 2018-01-23 NOTE — Progress Notes (Signed)
Bovard, MD, updated on pt status. Notified of blood pressures and orders received for preeclampsia focused order set. Updated on UC pattern, status of bleeding (2-3 small clots noted on towel) and FHR. No order for type and cross at this time.

## 2018-01-23 NOTE — Progress Notes (Signed)
Patient ID: Kathleen Bailey, female   DOB: Oct 03, 1980, 37 y.o.   MRN: [id number]   Foley bulb placed After explained with vietnamese interpreter  FHTs:140's mod var, + accels, category 1 toco: q   1/70/-2

## 2018-01-23 NOTE — Progress Notes (Signed)
[place] interpreter, Tonna Corner [phone] on the line to see if pt has any questions or concerns. None at this time.

## 2018-01-24 ENCOUNTER — Encounter (HOSPITAL_COMMUNITY): Payer: Self-pay | Admitting: Obstetrics and Gynecology

## 2018-01-24 DIAGNOSIS — Z8759 Personal history of other complications of pregnancy, childbirth and the puerperium: Secondary | ICD-10-CM

## 2018-01-24 HISTORY — DX: Personal history of other complications of pregnancy, childbirth and the puerperium: Z87.59

## 2018-01-24 LAB — CBC
HCT: 34.7 % — ABNORMAL LOW (ref 36.0–46.0)
HEMOGLOBIN: 11.4 g/dL — AB (ref 12.0–15.0)
MCH: 30.6 pg (ref 26.0–34.0)
MCHC: 32.9 g/dL (ref 30.0–36.0)
MCV: 93.3 fL (ref 78.0–100.0)
Platelets: 94 10*3/uL — ABNORMAL LOW (ref 150–400)
RBC: 3.72 MIL/uL — AB (ref 3.87–5.11)
RDW: 15 % (ref 11.5–15.5)
WBC: 22.7 10*3/uL — ABNORMAL HIGH (ref 4.0–10.5)

## 2018-01-24 LAB — RPR: RPR Ser Ql: NONREACTIVE

## 2018-01-24 MED ORDER — SIMETHICONE 80 MG PO CHEW: 80.0000 mg | CHEWABLE_TABLET | ORAL | Status: DC | PRN

## 2018-01-24 MED ORDER — WITCH HAZEL-GLYCERIN EX PADS: 1.0000 "application " | MEDICATED_PAD | CUTANEOUS | Status: DC | PRN

## 2018-01-24 MED ORDER — GENTAMICIN SULFATE 40 MG/ML IJ SOLN
160.0000 mg | Freq: Once | INTRAVENOUS | Status: AC
  Administered 2018-01-24: 160 mg via INTRAVENOUS
  Filled 2018-01-24: qty 4

## 2018-01-24 MED ORDER — ONDANSETRON HCL 4 MG/2ML IJ SOLN: 4.0000 mg | INTRAMUSCULAR | Status: DC | PRN

## 2018-01-24 MED ORDER — ONDANSETRON HCL 4 MG PO TABS: 4.0000 mg | ORAL_TABLET | ORAL | Status: DC | PRN

## 2018-01-24 MED ORDER — SENNOSIDES-DOCUSATE SODIUM 8.6-50 MG PO TABS
2.0000 | ORAL_TABLET | ORAL | Status: DC
  Administered 2018-01-25: 2 via ORAL
  Filled 2018-01-24: qty 2

## 2018-01-24 MED ORDER — GENTAMICIN SULFATE 40 MG/ML IJ SOLN
150.0000 mg | Freq: Three times a day (TID) | INTRAVENOUS | Status: DC
  Administered 2018-01-24: 150 mg via INTRAVENOUS
  Filled 2018-01-24 (×2): qty 3.75

## 2018-01-24 MED ORDER — IBUPROFEN 600 MG PO TABS
600.0000 mg | ORAL_TABLET | Freq: Four times a day (QID) | ORAL | Status: DC
  Administered 2018-01-25 – 2018-01-26 (×4): 600 mg via ORAL
  Filled 2018-01-24 (×4): qty 1

## 2018-01-24 MED ORDER — SODIUM CHLORIDE 0.9 % IV SOLN
2.0000 g | Freq: Four times a day (QID) | INTRAVENOUS | Status: DC
  Administered 2018-01-24 (×2): 2 g via INTRAVENOUS
  Filled 2018-01-24 (×2): qty 2
  Filled 2018-01-24 (×3): qty 2000

## 2018-01-24 MED ORDER — DIPHENHYDRAMINE HCL 25 MG PO CAPS: 25.0000 mg | ORAL_CAPSULE | Freq: Four times a day (QID) | ORAL | Status: DC | PRN

## 2018-01-24 MED ORDER — TETANUS-DIPHTH-ACELL PERTUSSIS 5-2.5-18.5 LF-MCG/0.5 IM SUSP: 0.5000 mL | Freq: Once | INTRAMUSCULAR | Status: DC

## 2018-01-24 MED ORDER — OXYCODONE HCL 5 MG PO TABS: 5.0000 mg | ORAL_TABLET | ORAL | Status: DC | PRN

## 2018-01-24 MED ORDER — OXYCODONE HCL 5 MG PO TABS: 10.0000 mg | ORAL_TABLET | ORAL | Status: DC | PRN

## 2018-01-24 MED ORDER — DIBUCAINE 1 % RE OINT: 1.0000 "application " | TOPICAL_OINTMENT | RECTAL | Status: DC | PRN

## 2018-01-24 MED ORDER — ACETAMINOPHEN 500 MG PO TABS
1000.0000 mg | ORAL_TABLET | Freq: Once | ORAL | Status: AC
  Administered 2018-01-24: 1000 mg via ORAL

## 2018-01-24 MED ORDER — ACETAMINOPHEN 325 MG PO TABS
975.0000 mg | ORAL_TABLET | Freq: Four times a day (QID) | ORAL | Status: DC | PRN
  Administered 2018-01-24: 975 mg via ORAL

## 2018-01-24 MED ORDER — BENZOCAINE-MENTHOL 20-0.5 % EX AERO
1.0000 "application " | INHALATION_SPRAY | CUTANEOUS | Status: DC | PRN
  Administered 2018-01-26: 1 via TOPICAL
  Filled 2018-01-24: qty 56

## 2018-01-24 MED ORDER — PRENATAL MULTIVITAMIN CH
1.0000 | ORAL_TABLET | Freq: Every day | ORAL | Status: DC
  Administered 2018-01-25 – 2018-01-26 (×2): 1 via ORAL
  Filled 2018-01-24 (×2): qty 1

## 2018-01-24 MED ORDER — LACTATED RINGERS IV SOLN: INTRAVENOUS | Status: DC

## 2018-01-24 MED ORDER — ACETAMINOPHEN 325 MG PO TABS
650.0000 mg | ORAL_TABLET | ORAL | Status: DC | PRN
  Administered 2018-01-26: 650 mg via ORAL
  Filled 2018-01-24: qty 2

## 2018-01-24 MED ORDER — COCONUT OIL OIL: 1.0000 "application " | TOPICAL_OIL | Status: DC | PRN

## 2018-01-24 MED ORDER — ZOLPIDEM TARTRATE 5 MG PO TABS: 5.0000 mg | ORAL_TABLET | Freq: Every evening | ORAL | Status: DC | PRN

## 2018-01-24 NOTE — Progress Notes (Signed)
Vietnamese interpreter Phoung-Paul [phone] on the line for SVE

## 2018-01-24 NOTE — Consult Note (Signed)
Neonatology Note:   Attendance at Delivery:    I was asked by Dr. Hinton Rao to attend this vacuum-assisted vaginal delivery at 37 2/7 weeks after IOL for maternal gestational HTN The mother is a G1P0 A pos, GBS neg with fever during labor to 102 degrees. She was treated with Ampicillin > 4 hours before delivery and 1 dose of Gentamicin about 3.5 hours before delivery. ROM 22 hours prior to delivery, fluid clear. Infant quiet, but with good spontaneous cry and tone. Delayed cord clamping was done. Needed only minimal bulb suctioning. Ap 7/9. Lungs clear to ausc in DR. Infant is able to remain with her mother for skin to skin time under nursing supervision. I spoke with her via family member interpreting, to let her know that the baby's exam is normal now, but that if she develops any symptoms, she might require treatment with antibiotics. Transferred to the care of Pediatrician.   Doretha Sou, MD

## 2018-01-24 NOTE — Progress Notes (Signed)
Pharmacy Antibiotic Note  Kathleen Bailey is a 37 y.o. G1P0 at 37+w  admitted on 01/23/2018 with PIH/PreE and low platelets for IOL.Pt now has increased temperature. Pharmacy has been consulted for gentamicin dosing.  Plan: Gentamicin 160 mg IV loading dose; then 150 mg IV every 8 hours Gentamicin serum level goals: peaks 6-8 mcg/ml; troughs <1 mcg/ml Monitor serum creatinine per protocol Serum gentamicin levles as indicated  Height: 5\' 1"  (154.9 cm) Weight: 174 lb (78.9 kg) IBW/kg (Calculated) : 47.8  Temp (24hrs), Avg:99 F (37.2 C), Min:98.2 F (36.8 C), Max:101.5 F (38.6 C)  Recent Labs  Lab 01/23/18 1136  WBC 9.3  CREATININE 0.58    Estimated Creatinine Clearance: 93.3 mL/min (by C-G formula based on SCr of 0.58 mg/dL).    No Known Allergies  Antimicrobials this admission: Ampicillin 2 GM 9/6>>   Dose adjustments this admission: N/A  Microbiology results:  Thank you for allowing pharmacy to be a part of this patient's care.  Arelia Sneddon 01/24/2018 2:36 AM

## 2018-01-24 NOTE — Progress Notes (Signed)
Patient ID: Kathleen Bailey, female   DOB: [birth year], 37 y.o.   MRN: [id number]   Interpreter via [place]  Pt w pressure/discomfort  AFVSS gen NAD FHTs 130's min/mod var, category 1 toco irr q  SVE 9.5/100/+2 Practice pushes not successful will PCA dose and rest  Expect SVD

## 2018-01-24 NOTE — Progress Notes (Signed)
At 0374 [place] interpreter, Lam [phone] on the line for SVE. At [facility] #[phone], on the line to discuss pts onset and location of pain.

## 2018-01-24 NOTE — Progress Notes (Signed)
[place] interpreter,  Chinita Pester [phone], on the line to explain to pt why antibiotics and tylenol have been ordered. Pt verbalized understanding

## 2018-01-24 NOTE — Progress Notes (Signed)
Spoke with anesthesia about low platelets at admission and level of 94 after delivery--orders to remove epidural cath. Per Dr. Hyacinth Meeker

## 2018-01-24 NOTE — Progress Notes (Signed)
[place] Interpreter, Kathie Rhodes [phone], on the phone for SVE

## 2018-01-24 NOTE — Progress Notes (Signed)
[place] interpreter [place] # [phone] on the line

## 2018-01-25 LAB — CBC
HCT: 25.8 % — ABNORMAL LOW (ref 36.0–46.0)
HEMOGLOBIN: 8.5 g/dL — AB (ref 12.0–15.0)
MCH: 30.6 pg (ref 26.0–34.0)
MCHC: 32.9 g/dL (ref 30.0–36.0)
MCV: 92.8 fL (ref 78.0–100.0)
Platelets: 86 10*3/uL — ABNORMAL LOW (ref 150–400)
RBC: 2.78 MIL/uL — AB (ref 3.87–5.11)
RDW: 14.9 % (ref 11.5–15.5)
WBC: 21.3 10*3/uL — ABNORMAL HIGH (ref 4.0–10.5)

## 2018-01-25 MED ORDER — INFLUENZA VAC SPLIT QUAD 0.5 ML IM SUSY: 0.5000 mL | PREFILLED_SYRINGE | INTRAMUSCULAR | Status: DC

## 2018-01-25 NOTE — Progress Notes (Signed)
Patients platelets 86, Dr. Ellyn Hack made aware. Ordered to continue with motrin. Will continue to monitor.

## 2018-01-25 NOTE — Anesthesia Postprocedure Evaluation (Signed)
Anesthesia Post Note  Patient: Kathleen Bailey  Procedure(s) Performed: AN AD HOC LABOR EPIDURAL     Patient location during evaluation: Mother Baby Anesthesia Type: Epidural Level of consciousness: awake and alert Pain management: pain level controlled Vital Signs Assessment: post-procedure vital signs reviewed and stable Respiratory status: spontaneous breathing, nonlabored ventilation and respiratory function stable Cardiovascular status: stable Postop Assessment: no headache, no backache, epidural receding and patient able to bend at knees Anesthetic complications: no    Last Vitals:  Vitals:   01/24/18 1953 01/25/18 0613  BP: 130/80 116/62  Pulse: 93 63  Resp: 18 18  Temp: 37.2 C 36.8 C  SpO2: 97%     Last Pain:  Vitals:   01/25/18 0615  TempSrc:   PainSc: 0-No pain   Pain Goal:                 Rica Records

## 2018-01-25 NOTE — Progress Notes (Signed)
Post Partum Day 1 (s/p VAVD) Subjective: no complaints, up ad lib, voiding, tolerating PO and nl lochia, pain cintrolled  Objective: Blood pressure 116/62, pulse 63, temperature 98.2 F (36.8 C), temperature source Oral, resp. rate 18, height 5\' 1"  (1.549 m), weight 78.9 kg, SpO2 97 %.  Physical Exam:  General: alert and no distress Lochia: appropriate Uterine Fundus: firm   Recent Labs    01/24/18 1624 01/25/18 0653  HGB 11.4* 8.5*  HCT 34.7* 25.8*    Assessment/Plan: Plan for discharge tomorrow, Breastfeeding and Lactation consult. Routine pp care   LOS: 2 days   Kathleen Bailey 01/25/2018, 8:40 AM

## 2018-01-25 NOTE — Lactation Note (Signed)
This note was copied from a baby's chart. Lactation Consultation Note  Patient Name: Kathleen Bailey Today's Date: 01/25/2018 Reason for consult: Other (Comment);Initial assessment;Early term 37-38.6wks;1st time breastfeeding;Term([place] interpreter - [place] - #[id number]/ see LC note for details ) - [place] - interpreter.  MBURN reported to [facility] mom desires to breast feed, information gathered from the patients mother .  AS LC entered the room , mom sitting on the side of th bed eating dinner, and had no eye contact with LC Or the I-pad interpreter. Mom kept looking at her mom to respond to this LC .  Grandmother confirmed mom does want to breastfeed , but has given a number of bottles.  LC reviewed supply and demand and the importance of giving the baby the opportunity to work on  Latching at the breast. Since the baby has had bottles may need. To supplement after feeding at  The breast.  Mother informed of post-discharge support and given phone number to the lactation department, including services for phone call assistance; out-patient appointments; and breastfeeding support group. List of other breastfeeding resources in the community given in the handout. Encouraged mother to call for problems or concerns related to breastfeeding.  LC encouraged mom and family to call when mom finishes her dinner.    Maternal Data    Feeding Feeding Type: (last fed at 1600 )  LATCH Score                   Interventions Interventions: Breast feeding basics reviewed  Lactation Tools Discussed/Used WIC Program: Yes   Consult Status Consult Status: Follow-up Date: 01/25/18 Follow-up type: In-patient    Matilde Sprang Tamel Abel 01/25/2018, 6:51 PM

## 2018-01-26 ENCOUNTER — Encounter (HOSPITAL_COMMUNITY): Payer: Self-pay | Admitting: *Deleted

## 2018-01-26 LAB — CBC
HEMATOCRIT: 26.1 % — AB (ref 36.0–46.0)
Hemoglobin: 8.6 g/dL — ABNORMAL LOW (ref 12.0–15.0)
MCH: 30.7 pg (ref 26.0–34.0)
MCHC: 33 g/dL (ref 30.0–36.0)
MCV: 93.2 fL (ref 78.0–100.0)
PLATELETS: 94 10*3/uL — AB (ref 150–400)
RBC: 2.8 MIL/uL — AB (ref 3.87–5.11)
RDW: 14.9 % (ref 11.5–15.5)
WBC: 17 10*3/uL — AB (ref 4.0–10.5)

## 2018-01-26 MED ORDER — IBUPROFEN 600 MG PO TABS: 600.0000 mg | ORAL_TABLET | Freq: Four times a day (QID) | ORAL | 1 refills | Status: DC | PRN

## 2018-01-26 NOTE — Lactation Note (Signed)
This note was copied from a baby's chart. Lactation Consultation Note: Follow up visit with mom. Family member on phone interpreted for me. Family member present in room called her before I could call [place]. Baby had formula 2 hours ago. Offered assist with latch and mom agreeable. Reviewed basic breast feeding. Baby took a few attempts then latched well and nursed for 20 min. Mom needed much assistance and review, Baby off to sleep. Family member asked for pump. Manual pump given with instruction for setup, use and cleaning of pump pieces. #27 flange given. Mom tried it and reports no pain with pump. Encouraged to always breast feed first then give formula if baby is still hungry. MOB said very little during my visit. Family member present answered most of the question. No questions at present,   Patient Name: Kathleen Bailey Today's Date: 01/26/2018 Reason for consult: Follow-up assessment;Early term 37-38.6wks   Maternal Data Formula Feeding for Exclusion: No Has patient been taught Hand Expression?: Yes Does the patient have breastfeeding experience prior to this delivery?: No  Feeding Feeding Type: Breast Fed Nipple Type: Slow - flow  LATCH Score Latch: Repeated attempts needed to sustain latch, nipple held in mouth throughout feeding, stimulation needed to elicit sucking reflex.  Audible Swallowing: A few with stimulation  Type of Nipple: Everted at rest and after stimulation  Comfort (Breast/Nipple): Soft / non-tender  Hold (Positioning): Full assist, staff holds infant at breast  LATCH Score: 6  Interventions Interventions: Breast feeding basics reviewed  Lactation Tools Discussed/Used     Consult Status Consult Status: Complete    Pamelia Hoit 01/26/2018, 9:15 AM

## 2018-01-26 NOTE — Progress Notes (Signed)
Patient ID: Kathleen Bailey, female   DOB: [birth year], 37 y.o.   MRN: [id number]  Hg 8.6 stable PLts 94 K from 22 K  Ok for discharge to home Instructions reviewed with family member via phone

## 2018-01-26 NOTE — Discharge Instructions (Signed)
Nothing in vagina for 6 weeks.  No sex, tampons, and douching.  Other instructions as in [facility] Discharge Booklet. °

## 2018-01-26 NOTE — Progress Notes (Signed)
Patient ID: Kathleen Bailey, female   DOB: 17-Jul-1980, 37 y.o.   MRN: [id number] Pt doing well. Sore but pain controlled with meds. Ambulating and tolerating diet. Some fatigue. Bonding well with baby. Family present VSS ABD - FF and 2-3cm below umbilicus EXT - no homans  A/P: PPD#2 s/p vavd; had HELLP         BP stable         Recheck cbc; if plts and Hg stable then will discharge to home later today

## 2018-01-26 NOTE — Progress Notes (Signed)
CSW received consult due to "slow brain function" in MOB and to assess for possible need for support and resources.  CSW met with MOB, MGM and maternal aunt/Loan to offer support and complete assessment.  Family was pleasant and inviting of CSW's visit.  MOB was quiet, but smiled when CSW asked how she is doing and feeling about becoming a mother.  She reports she is "very happy," and wants to be "a good mother."  She reports that she lives with her mother and that her mother and aunt are great supports.  They state they have everything they need for baby at home and are aware of SIDS precautions.  Aunt specifically states that she has instructed MOB not to sleep with baby because it is a risk for suffocation.  Aunt reports that MGM is in the home to "teach MOB how to care for her baby."  Their only question is in regards to their [facility] appointment and how to enroll baby.  CSW explained that representatives from the [facility] office come to the hospital and should be able to meet with them before discharge.   MGM and maternal aunt appear very supportive of MOB, who did not communicate much throughout the conversation and aunt spoke for her most of the time.  It seemed to CSW that aunt is used to speaking for MOB because she knows most of the answers.  She allowed MOB to speak when CSW asked about feelings/emotions.  CSW provided education regarding PMADs and encouraged MOB to talk with her doctor if she has concerns at any time.  She reports no emotional concerns at this time and no hx of mental illness.  CSW identifies no further need for intervention and no barriers to discharge when MOB and baby are medically ready to discharge.

## 2018-01-26 NOTE — Discharge Summary (Addendum)
Postpartum Discharge Summary     Patient Name: Kathleen Bailey DOB: 1980/10/15 MRN: [id number]  Date of admission: 01/23/2018 Delivering Provider: Sherian Rein   Date of discharge: 01/26/2018  Admitting diagnosis: 37wks induction  Intrauterine pregnancy: [redacted]w[redacted]d     Secondary diagnosis:  Principal Problem:   Status post vacuum-assisted vaginal delivery Active Problems:   PIH (pregnancy induced hypertension), third trimester   Indication for care in labor or delivery  Additional problems: gestational thrombocytopenia vs HELLP     Discharge diagnosis: Preeclampsia (mild)                                                                                                Post partum procedures:none  Augmentation: Pitocin and Foley Balloon  Complications: None  Hospital course:  Induction of Labor With Vaginal Delivery   37 y.o. yo G1P0 at [redacted]w[redacted]d was admitted to the hospital 01/23/2018 for induction of labor.  Indication for induction: Gestational hypertension and thrombocytopenia  vs HELLP.  Patient had an uncomplicated labor course as follows: Membrane Rupture Time/Date: 5:02 PM ,01/23/2018   Intrapartum Procedures: Episiotomy: None [1]                                         Lacerations:  Labial [10];2nd degree [3];Perineal [11]  Patient had delivery of a Viable infant.  Information for the patient's newborn:  Zeiser, Girl Jla [[id number]]  Delivery Method: Vag-Vacuum   01/24/2018  Details of delivery can be found in separate delivery note.  Patient had a routine postpartum course. Patient is discharged home 01/26/18.  Magnesium Sulfate recieved: No BMZ received: No  Physical exam  Vitals:   01/25/18 0613 01/25/18 1410 01/25/18 2224 01/26/18 0530  BP: 116/62 117/72 128/75 132/82  Pulse: 63 70 81 75  Resp: 18 17 18 16   Temp: 98.2 F (36.8 C) 97.6 F (36.4 C) 97.9 F (36.6 C) 98.6 F (37 C)  TempSrc: Oral Oral Oral Oral  SpO2:  96% 97% 96%  Weight:      Height:        General: alert Lochia: appropriate Uterine Fundus: firm Incision: N/A DVT Evaluation: No evidence of DVT seen on physical exam. Labs: Lab Results  Component Value Date   WBC 17.0 (H) 01/26/2018   HGB 8.6 (L) 01/26/2018   HCT 26.1 (L) 01/26/2018   MCV 93.2 01/26/2018   PLT 94 (L) 01/26/2018   CMP Latest Ref Rng & Units 01/23/2018  Glucose 70 - 99 mg/dL 56(O)  BUN 6 - 20 mg/dL 7  Creatinine 1.30 - 8.65 mg/dL 7.84  Sodium 696 - 295 mmol/L 133(L)  Potassium 3.5 - 5.1 mmol/L 4.4  Chloride 98 - 111 mmol/L 105  CO2 22 - 32 mmol/L 19(L)  Calcium 8.9 - 10.3 mg/dL 2.8(U)  Total Protein 6.5 - 8.1 g/dL 6.5  Total Bilirubin 0.3 - 1.2 mg/dL 0.5  Alkaline Phos 38 - 126 U/L 216(H)  AST 15 - 41 U/L 26  ALT 0 - 44 U/L 12  Discharge instruction: per After Visit Summary and "Baby and Me Booklet".  After visit meds:  Allergies as of 01/26/2018   No Known Allergies     Medication List    TAKE these medications   ibuprofen 600 MG tablet Commonly known as:  ADVIL,MOTRIN Take 1 tablet (600 mg total) by mouth every 6 (six) hours as needed for cramping.       Diet: low salt diet  Activity: Advance as tolerated. Pelvic rest for 6 weeks.   Outpatient follow up:1 week Follow up Appt:No future appointments. Follow up Visit:No follow-ups on file.   Please schedule this patient for Postpartum visit in: 1 week with the following provider: Dr Hinton Rao For C/S patients schedule nurse incision check in weeks 2 weeks: no High risk pregnancy complicated by: HTN Delivery mode:  SVD Anticipated Birth Control:  other/unsure PP Procedures needed: BP check  Schedule Integrated BH visit: no      Newborn Data: Live born female  Birth Weight: 7 lb 1 oz (3202 g) APGAR: 7, 9  Newborn Delivery   Birth date/time:  01/24/2018 14:43:00 Delivery type:  Vaginal, Spontaneous     Baby Feeding: Bottle Disposition:home with mother   01/26/2018 Cathrine Muster, DO

## 2018-01-27 ENCOUNTER — Inpatient Hospital Stay (HOSPITAL_COMMUNITY): Admission: RE | Admit: 2018-01-27 | Payer: BLUE CROSS/BLUE SHIELD | Source: Ambulatory Visit

## 2019-08-14 ENCOUNTER — Ambulatory Visit: Payer: BLUE CROSS/BLUE SHIELD | Attending: Internal Medicine

## 2019-08-14 DIAGNOSIS — Z23 Encounter for immunization: Secondary | ICD-10-CM

## 2019-08-14 NOTE — Progress Notes (Signed)
   Covid-19 Vaccination Clinic  Name:  LEZETTE KITTS    MRN: [id number] DOB: 05/12/1981  08/14/2019  Ms. Mahaffy was observed post Covid-19 immunization for 15 minutes without incident. She was provided with Vaccine Information Sheet and instruction to access the V-Safe system.   Ms. Barbier was instructed to call 911 with any severe reactions post vaccine: Marland Kitchen Difficulty breathing  . Swelling of face and throat  . A fast heartbeat  . A bad rash all over body  . Dizziness and weakness   Immunizations Administered    Name Date Dose VIS Date Route   Pfizer COVID-19 Vaccine 08/14/2019  1:52 PM 0.3 mL 04/30/2019 Intramuscular   Manufacturer: [employer], [employer]   Lot: [id number]   NDC: [address]-4

## 2019-08-31 ENCOUNTER — Emergency Department (HOSPITAL_COMMUNITY)
Admission: EM | Admit: 2019-08-31 | Discharge: 2019-09-04 | Disposition: A | Discharge status: Other Acute Inpatient Hospital | Payer: BLUE CROSS/BLUE SHIELD | Attending: Emergency Medicine | Admitting: Emergency Medicine

## 2019-08-31 ENCOUNTER — Emergency Department (HOSPITAL_COMMUNITY): Payer: BLUE CROSS/BLUE SHIELD

## 2019-08-31 ENCOUNTER — Encounter (HOSPITAL_COMMUNITY): Payer: Self-pay | Admitting: Emergency Medicine

## 2019-08-31 DIAGNOSIS — F061 Catatonic disorder due to known physiological condition: Secondary | ICD-10-CM | POA: Diagnosis not present

## 2019-08-31 DIAGNOSIS — F23 Brief psychotic disorder: Secondary | ICD-10-CM | POA: Insufficient documentation

## 2019-08-31 DIAGNOSIS — R462 Strange and inexplicable behavior: Secondary | ICD-10-CM | POA: Diagnosis present

## 2019-08-31 DIAGNOSIS — Z20822 Contact with and (suspected) exposure to covid-19: Secondary | ICD-10-CM | POA: Insufficient documentation

## 2019-08-31 LAB — RESPIRATORY PANEL BY RT PCR (FLU A&B, COVID)
Influenza A by PCR: NEGATIVE
Influenza B by PCR: NEGATIVE
SARS Coronavirus 2 by RT PCR: NEGATIVE

## 2019-08-31 LAB — HCG, QUANTITATIVE, PREGNANCY: hCG, Beta Chain, Quant, S: <1 m[IU]/mL (ref <5)

## 2019-08-31 LAB — COMPREHENSIVE METABOLIC PANEL
ALT: 25 U/L (ref 0–44)
AST: 26 U/L (ref 15–41)
Albumin: 4.5 g/dL (ref 3.5–5.0)
Alkaline Phosphatase: 82 U/L (ref 38–126)
Anion gap: 11 (ref 5–15)
BUN: 12 mg/dL (ref 6–20)
CO2: 22 mmol/L (ref 22–32)
Calcium: 9.3 mg/dL (ref 8.9–10.3)
Chloride: 104 mmol/L (ref 98–111)
Creatinine, Ser: 0.58 mg/dL (ref 0.44–1.00)
GFR calc Af Amer: >60 mL/min (ref >60)
GFR calc non Af Amer: >60 mL/min (ref >60)
Glucose, Bld: 110 mg/dL — ABNORMAL HIGH (ref 70–99)
Potassium: 3.9 mmol/L (ref 3.5–5.1)
Sodium: 137 mmol/L (ref 135–145)
Total Bilirubin: 0.8 mg/dL (ref 0.3–1.2)
Total Protein: 9.1 g/dL — ABNORMAL HIGH (ref 6.5–8.1)

## 2019-08-31 LAB — CBC WITH DIFFERENTIAL/PLATELET
Abs Immature Granulocytes: 0.08 10*3/uL — ABNORMAL HIGH (ref 0.00–0.07)
Basophils Absolute: 0 10*3/uL (ref 0.0–0.1)
Basophils Relative: 0 %
Eosinophils Absolute: 0 10*3/uL (ref 0.0–0.5)
Eosinophils Relative: 0 %
HCT: 45.6 % (ref 36.0–46.0)
Hemoglobin: 14.7 g/dL (ref 12.0–15.0)
Immature Granulocytes: 1 %
Lymphocytes Relative: 14 %
Lymphs Abs: 1.9 10*3/uL (ref 0.7–4.0)
MCH: 29 pg (ref 26.0–34.0)
MCHC: 32.2 g/dL (ref 30.0–36.0)
MCV: 89.9 fL (ref 80.0–100.0)
Monocytes Absolute: 0.6 10*3/uL (ref 0.1–1.0)
Monocytes Relative: 5 %
Neutro Abs: 10.6 10*3/uL — ABNORMAL HIGH (ref 1.7–7.7)
Neutrophils Relative %: 80 %
Platelets: 195 10*3/uL (ref 150–400)
RBC: 5.07 MIL/uL (ref 3.87–5.11)
RDW: 12.7 % (ref 11.5–15.5)
WBC: 13.3 10*3/uL — ABNORMAL HIGH (ref 4.0–10.5)
nRBC: 0 % (ref 0.0–0.2)

## 2019-08-31 LAB — VITAMIN B12: Vitamin B-12: 922 pg/mL — ABNORMAL HIGH (ref 180–914)

## 2019-08-31 LAB — ETHANOL: Alcohol, Ethyl (B): <10 mg/dL (ref <10)

## 2019-08-31 LAB — TSH: TSH: 1.34 u[IU]/mL (ref 0.350–4.500)

## 2019-08-31 MED ORDER — ZOLPIDEM TARTRATE 5 MG PO TABS: 5.0000 mg | ORAL_TABLET | Freq: Every evening | ORAL | Status: DC | PRN

## 2019-08-31 MED ORDER — ACETAMINOPHEN 325 MG PO TABS: 650.0000 mg | ORAL_TABLET | ORAL | Status: DC | PRN

## 2019-08-31 MED ORDER — ZIPRASIDONE MESYLATE 20 MG IM SOLR
20.0000 mg | INTRAMUSCULAR | Status: AC | PRN
  Administered 2019-08-31: 20 mg via INTRAMUSCULAR
  Filled 2019-08-31: qty 20

## 2019-08-31 MED ORDER — ONDANSETRON HCL 4 MG PO TABS: 4.0000 mg | ORAL_TABLET | Freq: Three times a day (TID) | ORAL | Status: DC | PRN

## 2019-08-31 MED ORDER — LORAZEPAM 2 MG/ML IJ SOLN
1.0000 mg | Freq: Once | INTRAMUSCULAR | Status: AC
  Administered 2019-08-31: 1 mg via INTRAMUSCULAR
  Filled 2019-08-31: qty 1

## 2019-08-31 MED ORDER — RISPERIDONE 1 MG PO TBDP: 2.0000 mg | ORAL_TABLET | Freq: Three times a day (TID) | ORAL | Status: DC | PRN

## 2019-08-31 MED ORDER — LORAZEPAM 1 MG PO TABS: 1.0000 mg | ORAL_TABLET | ORAL | Status: DC | PRN

## 2019-08-31 MED ORDER — GADOBUTROL 1 MMOL/ML IV SOLN
8.0000 mL | Freq: Once | INTRAVENOUS | Status: AC | PRN
  Administered 2019-08-31: 7 mL via INTRAVENOUS

## 2019-08-31 MED ORDER — STERILE WATER FOR INJECTION IJ SOLN
INTRAMUSCULAR | Status: AC
  Administered 2019-08-31: 10 mL
  Filled 2019-08-31: qty 10

## 2019-08-31 NOTE — BH Assessment (Addendum)
Assessment Note  Kathleen Bailey is an 38 y.o. female. Patient brought here by EMS called by her aunt.  Per ED notes, "The aunt reports that she called EMS after patient has been behaving abnormally for the last month.  Until then she was doing well.  Over the last 1 month patient has become more secluded.  Patient also started telling that that her " real parents" are looking for her.  She has been more delusional.  Over the last few days she has stopped taking care of her child and has locked herself in the room.  Patient has not ate or drank anything.  EMS was called because of continued abnormal behavior. Pt doesn't work anymore. No new stress noted by them."  Counselor attempted to assess patient via teleassessment using the interpreter machine. Patient speaks [place] Patient did not respond to any questions asked. She was observed laying in the bed with the covers cover her body and most of her head. Limited movement was observed. Counselor unable to determine if patient is experiencing SI, HI, and AVH's.   Contacted patient's aunt for collateral information: Francee Gentile) [phone]. Ms. Allyne Gee states that patient lives with her parents and 63 month baby. States that over the past month patient "on and off" has in a very bizarre manner. She speaks of non sensible subjects. Her explains that patient has spoke of parents being dead and they are both living. She has told her parents that she is killer and that she was adopted. She is speaking stealing things form people. She is not showering, eating, and sleeping more than normal. Her aunt had to wake her up Monday stating she slept almost an entire day. States that when they woke her up she was "out of it and talking crazy"., She normally engages with her 76 month old but is no longer doing so. Her aunt denies that she has a psychiatric history or has every displayed such behaviors. No history of drug use reported. Her aunt states that her mother  had an episode of not eating and/or drinking in the past. States that her mother was given an IV hasn't had any issue since.   Diagnosis: Psychosis due  Past Medical History:  Past Medical History:  Diagnosis Date  . PIH (pregnancy induced hypertension), third trimester 01/23/2018  . Pregnancy induced hypertension   . Status post vacuum-assisted vaginal delivery 01/24/2018    History reviewed. No pertinent surgical history.  Family History:  Family History  Problem Relation Age of Onset  . Hyperkalemia Mother   . Stroke Mother   . Hypertension Maternal Grandmother 32    Social History:  reports that she has never smoked. She has never used smokeless tobacco. No history on file for alcohol and drug.  Additional Social History:     CIWA: CIWA-Ar BP: (!) 154/101 Pulse Rate: (!) 126 COWS:    Allergies: No Known Allergies  Home Medications: (Not in a hospital admission)   OB/GYN Status:  No LMP recorded.  General Assessment Data Is this a Tele or Face-to-Face Assessment?: Tele Assessment Is this an Initial Assessment or a Re-assessment for this encounter?: Initial Assessment Patient Accompanied by:: (EMS) Language Other than English: Yes What is your preferred language: Other (Comment: Enter the language)(Vietnamese) Living Arrangements: (with parents and baby) What gender do you identify as?: Female Marital status: Married [employer] name: (n/a) Pregnancy Status: No Living Arrangements: Parent Can pt return to current living arrangement?: Yes Admission Status: Voluntary Is patient capable  of signing voluntary admission?: Yes Referral Source: Self/Family/Friend Insurance type: Herbalist)     Crisis Care Plan Living Arrangements: Parent Legal Guardian: (no legal guardian ) Name of Psychiatrist: (no psychiatrist ) Name of Therapist: (no therapist )  Education Status Is patient currently in school?: No Is the patient employed, unemployed or receiving disability?:  Unemployed  Risk to self with the past 6 months Suicidal Ideation: (Patient did not confirm or deny) Has patient been a risk to self within the past 6 months prior to admission? : No Suicidal Intent: (unk) Has patient had any suicidal intent within the past 6 months prior to admission? : (unk) Is patient at risk for suicide?: (unk) Suicidal Plan?: (unk) Has patient had any suicidal plan within the past 6 months prior to admission? : (unk ) Access to Means: (unk) What has been your use of drugs/alcohol within the last 12 months?: (unk) Previous Attempts/Gestures: No How many times?: (0) Other Self Harm Risks: (no noted self history ) Triggers for Past Attempts: (no triggers for past attempts andd/or gestures) Intentional Self Injurious Behavior: None Family Suicide History: Unable to assess Recent stressful life event(s): Other (Comment)(none noted by patient ) Persecutory voices/beliefs?: No Depression: (unk) Depression Symptoms: (unk) Substance abuse history and/or treatment for substance abuse?: (unk) Suicide prevention information given to non-admitted patients: (unk)  Risk to Others within the past 6 months Homicidal Ideation: No Does patient have any lifetime risk of violence toward others beyond the six months prior to admission? : No Thoughts of Harm to Others: No Current Homicidal Intent: No Current Homicidal Plan: No Access to Homicidal Means: No Identified Victim: (n/a) History of harm to others?: No Assessment of Violence: None Noted Violent Behavior Description: (patient is calm and cooperative ) Does patient have access to weapons?: No Criminal Charges Pending?: No Does patient have a court date: No Is patient on probation?: No  Psychosis Hallucinations: (patient did not confirm or deny) Delusions: Unspecified  Mental Status Report Appearance/Hygiene: In scrubs Eye Contact: Poor Motor Activity: Unable to assess Speech: Unable to assess Level of  Consciousness: Unable to assess Mood: (UTA) Affect: Unable to Assess Anxiety Level: (UTA) Thought Processes: Unable to Assess Judgement: Unable to Assess Orientation: (UTA) Obsessive Compulsive Thoughts/Behaviors: Unable to Assess  Cognitive Functioning Concentration: Unable to Assess Memory: Unable to Assess Is patient IDD: No Insight: Unable to Assess Impulse Control: Unable to Assess Appetite: (UTA) Have you had any weight changes? : (UTA) Sleep: Unable to Assess Total Hours of Sleep: (unknown ) Vegetative Symptoms: Unable to Assess  ADLScreening [facility] Assessment Services) Patient's cognitive ability adequate to safely complete daily activities?: (unk) Patient able to express need for assistance with ADLs?: (unk) Independently performs ADLs?: (unk; pt's aunt states that pt doesn not use substances)  Prior Inpatient Therapy Prior Inpatient Therapy: No  Prior Outpatient Therapy Prior Outpatient Therapy: No Does patient have an ACCT team?: No Does patient have Intensive In-House Services?  : No Does patient have [facility] services? : No Does patient have P4CC services?: No  ADL Screening (condition at time of admission) Patient's cognitive ability adequate to safely complete daily activities?: (unk) Is the patient deaf or have difficulty hearing?: (unk) Does the patient have difficulty seeing, even when wearing glasses/contacts?: (unk) Does the patient have difficulty concentrating, remembering, or making decisions?: (unk) Patient able to express need for assistance with ADLs?: (unk) Does the patient have difficulty dressing or bathing?: (unk) Independently performs ADLs?: (unk; pt's aunt states that pt doesn not use substances)  Home Assistive Devices/Equipment Home Assistive Devices/Equipment: None    Abuse/Neglect Assessment (Assessment to be complete while patient is alone) Abuse/Neglect Assessment Can Be Completed: (unk)     Advance Directives (For  Healthcare) Does Patient Have a Medical Advance Directive?: No          Disposition: Per Shuvon, Rankin, NP, patient meets inpatient treatment.  Disposition Initial Assessment Completed for this Encounter: Yes  On Site Evaluation by:   Reviewed with Physician:    Waldon Merl 08/31/2019 4:42 PM

## 2019-08-31 NOTE — Progress Notes (Signed)
Patient meets inpatient criteria per Assunta Found, NP. Patient has been faxed out to the following facilities for review:   [facility]  [facility]  [facility]  [facility]  [facility]  [facility]  CSW will continue to follow and assist with disposition planning.   Drucilla Schmidt, MSW, LCSW-A Clinical Disposition Social Worker [facility]/TTS [phone]

## 2019-08-31 NOTE — ED Provider Notes (Addendum)
[facility]-EMERGENCY DEPT Provider Note   CSN: [id number] Arrival date & time: 08/31/19  4166     History Chief Complaint  Patient presents with  . Bizarre Behavior    Kathleen Bailey is a 39 y.o. female.  HPI    39 year old female comes in with chief complaint of abnormal behavior.  She has no medical or psychiatric history.  Patient speaks [place] only.  Translation service was utilized.  Patient brought here by her aunt.  She reports that she called EMS after patient has been behaving abnormally for the last month.  Until then she was doing well.  Over the last 1 month patient has become more secluded.  Patient also started telling that that her " real parents" are looking for her.  She has been more delusional.  Over the last few days she has stopped taking care of her child and has locked herself in the room.  Patient has not ate or drank anything.  EMS was called because of continued abnormal behavior.  Pt doesn't work anymore. No new stress noted by them.  Past Medical History:  Diagnosis Date  . PIH (pregnancy induced hypertension), third trimester 01/23/2018  . Pregnancy induced hypertension   . Status post vacuum-assisted vaginal delivery 01/24/2018    Patient Active Problem List   Diagnosis Date Noted  . Status post vacuum-assisted vaginal delivery 01/24/2018  . PIH (pregnancy induced hypertension), third trimester 01/23/2018  . Indication for care in labor or delivery 01/23/2018  . Slow transit constipation 12/06/2014  . Anal pain 12/06/2014  . Internal hemorrhoid 12/06/2014    History reviewed. No pertinent surgical history.   OB History    Gravida  1   Para      Term      Preterm      AB      Living        SAB      TAB      Ectopic      Multiple      Live Births              Family History  Problem Relation Age of Onset  . Hyperkalemia Mother   . Stroke Mother   . Hypertension Maternal Grandmother 61     Social History   Tobacco Use  . Smoking status: Never Smoker  . Smokeless tobacco: Never Used  Substance Use Topics  . Alcohol use: Not on file  . Drug use: Not on file    Home Medications Prior to Admission medications   Medication Sig Start Date End Date Taking? Authorizing Provider  ibuprofen (ADVIL,MOTRIN) 600 MG tablet Take 1 tablet (600 mg total) by mouth every 6 (six) hours as needed for cramping. Patient not taking: Reported on 08/31/2019 01/26/18   Edwinna Areola, DO    Allergies    Patient has no known allergies.  Review of Systems   Review of Systems  Unable to perform ROS: Mental status change    Physical Exam Updated Vital Signs BP (!) 154/101 (BP Location: Right Arm)   Pulse (!) 126   Temp 98 F (36.7 C) (Oral)   Resp (!) 22   SpO2 100%   Physical Exam Vitals and nursing note reviewed.  Constitutional:      Appearance: She is well-developed.  HENT:     Head: Atraumatic.  Cardiovascular:     Rate and Rhythm: Normal rate.  Pulmonary:     Effort: Pulmonary effort is  normal.  Skin:    General: Skin is warm.  Neurological:     Mental Status: She is alert.  Psychiatric:     Comments: Patient is not responding to any query.  She has her head covered throughout the interaction.  She appears catatonic.     ED Results / Procedures / Treatments   Labs (all labs ordered are listed, but only abnormal results are displayed) Labs Reviewed  COMPREHENSIVE METABOLIC PANEL - Abnormal; Notable for the following components:      Result Value   Glucose, Bld 110 (*)    Total Protein 9.1 (*)    All other components within normal limits  CBC WITH DIFFERENTIAL/PLATELET - Abnormal; Notable for the following components:   WBC 13.3 (*)    Neutro Abs 10.6 (*)    Abs Immature Granulocytes 0.08 (*)    All other components within normal limits  RESPIRATORY PANEL BY RT PCR (FLU A&B, COVID)  ETHANOL  HCG, QUANTITATIVE, PREGNANCY  RAPID URINE DRUG SCREEN,  HOSP PERFORMED  URINALYSIS, ROUTINE W REFLEX MICROSCOPIC  TSH  VITAMIN B12  I-STAT BETA HCG BLOOD, ED ([facility], [facility], [facility] ONLY)    EKG EKG Interpretation  Date/Time:  Tuesday August 31 2019 16:32:29 EDT Ventricular Rate:  124 PR Interval:  124 QRS Duration: 72 QT Interval:  312 QTC Calculation: 448 R Axis:   89 Text Interpretation: Sinus tachycardia Otherwise normal ECG No acute changes No old tracing to compare Confirmed by Derwood Kaplan [phone]) on 08/31/2019 4:39:34 PM   Radiology No results found.  Procedures .Critical Care Performed by: Derwood Kaplan, MD Authorized by: Derwood Kaplan, MD   Critical care provider statement:    Critical care time (minutes):  35   Critical care was necessary to treat or prevent imminent or life-threatening deterioration of the following conditions:  CNS failure or compromise   Critical care was time spent personally by me on the following activities:  Discussions with consultants, evaluation of patient's response to treatment, examination of patient, ordering and performing treatments and interventions, ordering and review of laboratory studies, ordering and review of radiographic studies, pulse oximetry, re-evaluation of patient's condition, obtaining history from patient or surrogate and review of old charts   (including critical care time)  Medications Ordered in ED Medications  risperiDONE (RISPERDAL M-TABS) disintegrating tablet 2 mg (has no administration in time range)    And  LORazepam (ATIVAN) tablet 1 mg (has no administration in time range)    And  ziprasidone (GEODON) injection 20 mg (20 mg Intramuscular Given 08/31/19 1534)  acetaminophen (TYLENOL) tablet 650 mg (has no administration in time range)  ondansetron (ZOFRAN) tablet 4 mg (has no administration in time range)  zolpidem (AMBIEN) tablet 5 mg (has no administration in time range)  LORazepam (ATIVAN) injection 1 mg (1 mg Intramuscular Given 08/31/19 1244)  sterile water  (preservative free) injection (10 mLs  Given 08/31/19 1534)    ED Course  I have reviewed the triage vital signs and the nursing notes.  Pertinent labs & imaging results that were available during my care of the patient were reviewed by me and considered in my medical decision making (see chart for details).  Clinical Course as of Aug 30 1699  Tue Aug 31, 2019  1534 Patient continues to be slightly agitated. Labs are reassuring.  EKG still pending.  Patient is getting combative, we will order IM Geodon.  We need EKG. she had received IM Ativan earlier which had made her somnolent and  less combative.   [AN]  1700 All 4 labs are reassuring.  We will get TSH and B12 added.  Dr. Sherry Ruffing to follow-up.  Patient is tachycardic on EKG.  Unclear reason. I have advised the nursing staff to inform us if patient continues to not eat or drink, as then she will need possibly repeat labs and admission to the hospital.   [AN]    Clinical Course User Index [AN] Varney Biles, MD   MDM Rules/Calculators/A&P                      39 year old comes in a chief complaint of mental status change and behavioral change.  She appears to be catatonic. We will consult psychiatry team.  Patient has no medical diagnosis and her behavior has gradually worsened over the last 1 month, outside of electrolyte abnormalities I suspect there is no other primary underlying medical issue.  Final Clinical Impression(s) / ED Diagnoses Final diagnoses:  Catatonia    Rx / DC Orders ED Discharge Orders    None           Varney Biles, MD 08/31/19 1701

## 2019-08-31 NOTE — ED Notes (Signed)
Pt brought to room 27. Pt eyes closed, not responding or answering questions. Uncooperative with care. Ativan was given . Labs were able to be drawn.

## 2019-08-31 NOTE — Progress Notes (Signed)
Pt accepted to [facility], [address] [place]. Room to be determined.   Dr. Estill Cotta is the accepting and attending physician.   Call report to [phone].    Beth @ [facility] ED notified.     Pt is Voluntary.    Pt may be transported by [employer].   Pt scheduled to arrive September 01, 2019 after 10:00am pending negative COVID screen. Please fax COVID screen results to [phone].   Drucilla Schmidt, MSW, LCSW-A Clinical Disposition Social Worker [facility]/TTS [phone]

## 2019-08-31 NOTE — ED Notes (Signed)
Megan RN from ED inserted a 20 gauge IV in patients L hand for her MRI.

## 2019-08-31 NOTE — ED Triage Notes (Signed)
Patient here from home with bizarre behavior. Started Sunday, not eating or drinking. Not talking, not wanting to tend to child.

## 2019-08-31 NOTE — ED Notes (Signed)
Off floor for MRI.

## 2019-08-31 NOTE — ED Provider Notes (Signed)
4:51 PM Care assumed from Dr. Rhunette Croft.  At time of transfer care, patient is awaiting diagnostic work-up to return prior to TTS evaluation.  Given the behavioral changes over the last month including speech changes, decision was made to get MRI with and without to look for intracranial abnormality such as malignancy causing her behavioral changes.  After work-up is completed, TTS will be called.  8:56 PM MRI just was completed and was completely normal.  No evidence of stroke, mass, or other acute abnormality.  Patient still waiting for results of urinalysis and TSH prior to full medical clearance for psychiatric management.  Anticipate following up on these labs before psychiatric management.  10:41 PM Spoke with the psychiatric nurse who reports that patient will be able to be accepted to a psychiatric facility once her work-up is returned and she has medically cleared for psychiatric management.  She will likely go in the morning.  Patient still awaiting for urinalysis but is still sleeping after the medication she was given for agitation earlier today.  Since they recommended IVC given the patient's bizarre behavior, and her being a clear danger to her family as she was neglecting the infant.  They do not feel she is safe for discharge if she tries to leave.  IVC paperwork was filled out and given to Diplomatic Services operational officer for completion.  Once her urinalysis completed, she will likely be medically cleared for psych management in the morning.  12:46 AM Care transferred to oncoming team while awaiting results of urinalysis prior to TTS clearance.  Anticipate transfer to psychiatric facility tomorrow.    Kathleen Bailey, Canary Brim, MD 09/01/19 [phone]

## 2019-08-31 NOTE — ED Notes (Signed)
Called MRI to check on her time status for her imaging. She is next in line but before they get here she needs to have a IV in. Called charge for someone in ED to insert one for this Clinical research associate. RN here now to do that. She is sleeping deeply from receiving Geodon and Ativan earlier in the day.

## 2019-08-31 NOTE — ED Notes (Signed)
Pt resting comfortably

## 2019-08-31 NOTE — ED Notes (Signed)
Ke[place], [place] interpreter will be here in 20 minutes for this patient.

## 2019-08-31 NOTE — ED Notes (Signed)
Pt alert at times, talking to person that is staying with her. Pt is having delusional thoughts and nonsensical.  Pt uncooperative with care

## 2019-08-31 NOTE — ED Notes (Signed)
Notified Dr of need to do an IVC on patient as she is not able to comprehend and admission was necessitated due to her inability to care for self and her 11 month old child. Waiting on a UA to medically clear her per Dr. Sherron Monday with charge re cathing her but charge felt could wait a while longer to see if when she wakes up she can provide a sample without the need to cath her due to it being potentially traumatizing and with her confusion and disorganization complicated further by her being a non [place] speaker. She has a disposition to [facility] in am after 10 once she is medically cleared which requires a urine sample.

## 2019-08-31 NOTE — ED Notes (Signed)
Returned to room from MRI. The tech returning her informed me she had a small amount of emesis while in the MRI lab. She remains asleep at this time. Vitals rechecked.

## 2019-09-01 ENCOUNTER — Encounter (HOSPITAL_COMMUNITY): Payer: Self-pay | Admitting: Registered Nurse

## 2019-09-01 DIAGNOSIS — F23 Brief psychotic disorder: Secondary | ICD-10-CM | POA: Diagnosis not present

## 2019-09-01 LAB — URINALYSIS, ROUTINE W REFLEX MICROSCOPIC
Bacteria, UA: NONE SEEN
Bilirubin Urine: NEGATIVE
Glucose, UA: NEGATIVE mg/dL
Hgb urine dipstick: NEGATIVE
Ketones, ur: 20 mg/dL — AB
Leukocytes,Ua: NEGATIVE
Nitrite: NEGATIVE
Protein, ur: 100 mg/dL — AB
Specific Gravity, Urine: 1.028 (ref 1.005–1.030)
pH: 5 (ref 5.0–8.0)

## 2019-09-01 LAB — RAPID URINE DRUG SCREEN, HOSP PERFORMED
Amphetamines: NOT DETECTED
Barbiturates: NOT DETECTED
Benzodiazepines: POSITIVE — AB
Cocaine: NOT DETECTED
Opiates: NOT DETECTED
Tetrahydrocannabinol: NOT DETECTED

## 2019-09-01 LAB — BASIC METABOLIC PANEL
Anion gap: 11 (ref 5–15)
BUN: 25 mg/dL — ABNORMAL HIGH (ref 6–20)
CO2: 25 mmol/L (ref 22–32)
Calcium: 9.5 mg/dL (ref 8.9–10.3)
Chloride: 107 mmol/L (ref 98–111)
Creatinine, Ser: 0.73 mg/dL (ref 0.44–1.00)
GFR calc Af Amer: >60 mL/min (ref >60)
GFR calc non Af Amer: >60 mL/min (ref >60)
Glucose, Bld: 88 mg/dL (ref 70–99)
Potassium: 4.2 mmol/L (ref 3.5–5.1)
Sodium: 143 mmol/L (ref 135–145)

## 2019-09-01 LAB — MAGNESIUM: Magnesium: 2.5 mg/dL — ABNORMAL HIGH (ref 1.7–2.4)

## 2019-09-01 MED ORDER — LORAZEPAM 1 MG PO TABS
1.0000 mg | ORAL_TABLET | Freq: Three times a day (TID) | ORAL | Status: DC
  Filled 2019-09-01: qty 1

## 2019-09-01 MED ORDER — LORAZEPAM 2 MG/ML IJ SOLN
1.0000 mg | Freq: Three times a day (TID) | INTRAMUSCULAR | Status: DC
  Administered 2019-09-01 – 2019-09-02 (×3): 1 mg via INTRAMUSCULAR
  Filled 2019-09-01 (×3): qty 1

## 2019-09-01 MED ORDER — LORAZEPAM 2 MG/ML IJ SOLN
2.0000 mg | Freq: Once | INTRAMUSCULAR | Status: AC
  Administered 2019-09-01: 2 mg via INTRAMUSCULAR
  Filled 2019-09-01: qty 1

## 2019-09-01 MED ORDER — ZIPRASIDONE MESYLATE 20 MG IM SOLR
10.0000 mg | Freq: Once | INTRAMUSCULAR | Status: AC
  Administered 2019-09-01: 10 mg via INTRAMUSCULAR
  Filled 2019-09-01: qty 20

## 2019-09-01 MED ORDER — STERILE WATER FOR INJECTION IJ SOLN
INTRAMUSCULAR | Status: AC
  Administered 2019-09-01: 10 mL
  Filled 2019-09-01: qty 10

## 2019-09-01 NOTE — Progress Notes (Signed)
Received Daveigh awake in her bed with her head under the sheet. She is assessed talking to herself. The sitter is at the bedside. Translator informed patient to change into scrubs, she refused to cooperate with the request. She refused PO medication therefore IM Ativan was given. She was observed drinking water and looking at her dinner tray. She refused a sleeping pill and slept 4 hours. She woke up and was talking out loud in her native language.

## 2019-09-01 NOTE — ED Notes (Signed)
Dr. Rhunette Croft is aware that pt is not eating or drinking.

## 2019-09-01 NOTE — ED Notes (Signed)
Pt has not urinated all day as well. She did get up once to turn the light out.

## 2019-09-01 NOTE — ED Notes (Signed)
Tried to start IV to draw blood and have IV available per Dr. Rhunette Croft. Unable to access vein.

## 2019-09-01 NOTE — ED Provider Notes (Addendum)
Ordering BMP and mag as patient continues to not eat or drink. At the time of admission her labs are reassuring.  She is awaiting transfer to psych facility.   Derwood Kaplan, MD 09/01/19 1413  4:01 PM Labs look reassuring. She remains medially cleared for psych eval.   Derwood Kaplan, MD 09/01/19 [phone]

## 2019-09-01 NOTE — ED Notes (Signed)
Tried to call report. No answer.

## 2019-09-01 NOTE — ED Notes (Signed)
Woke up and is crying and wailing loudly. Unable to console her. Used language interpreter and she repeated over and over she wants to go home. Continues to make loud noises of distress. Dr called and he gave an order for 10mg  of Geodon. Will wait till she feels the benefits of the Geodon and remove the IV in her hand from the MRI and do and in and out cath since she is unable to cooperate with giving a urine specimen. She took the injection with little resistance.

## 2019-09-01 NOTE — ED Notes (Signed)
Is currently crying and wailing though not as loud as previously.

## 2019-09-01 NOTE — ED Notes (Signed)
IV started right hand by Gillis Ends and labs ordered drawn from the IV start. Pt drew back her hand and had to have her hand held and arm steadied but otherwise no distress.  20 g placed in right hand.

## 2019-09-01 NOTE — BH Assessment (Signed)
[facility] Assessment Progress Note  Per Shuvon Rankin, FNP, this pt requires psychiatric hospitalization.  At 15:02 [place] calls to report that pt has been accepted to [facility] by Dr Toni Amend to Rm 301.  Pt presents under IVC initiated by EDP Lynden Oxford, MD, and IVC documents have been faxed to [phone].  This Clinical research associate informed pt's nurse, Diane.  She reports that pt is not medically stable for transfer at this time.  This was staffed with [facility], who agrees to reach out to Nelly Rout, MD and to EDP Derwood Kaplan, MD to discuss medical clearance.  When the time comes, please call report to [phone].  Pt is to be transported via [facility].   Doylene Canning, [place] [facility] Coordinator [phone]

## 2019-09-01 NOTE — ED Notes (Signed)
Pt's aunt called and said that if we cannot do anything for her they will just pick her up.

## 2019-09-01 NOTE — ED Notes (Signed)
Pt has been awake on and off this morning. Currently she is as if catatonic. Did not respond to breakfast tray. Did not respond to this writer including a mild sternal rub. This Clinical research associate observed her moving her hand once. Skin is warm and dry.  Because of this pt is not appropriate for transport by sheriff at this time, unless it is by [facility].

## 2019-09-01 NOTE — ED Notes (Signed)
Spoke with Dr. Lucianne Muss about pt appearing catatonic and not appropriate for transport. Medication ordered.

## 2019-09-01 NOTE — ED Notes (Signed)
Pt given Ativan IM.  Interpreter explained the procedure and the transfer to her even though she did not respond. Now she is more responsive, but not talking and not following directions.

## 2019-09-01 NOTE — ED Notes (Signed)
Pt somewhat more active but continues to keep eyes closed, scratches her head, rubs her face. To fairly firm sternal rub she would not respond to this writer except to kick her legs around a little afterwards.
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
## 2019-09-01 NOTE — ED Notes (Signed)
Pt in bed with eyes closed. Rise and fall of chest observed.
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
## 2019-09-01 NOTE — ED Notes (Signed)
Spoke with [place], Howard Pouch RN, who will not take pt until she can do her own ADLs. Currently pt is responding better to painful or aggravating stimuli, but not able to follow commands in her native tongue.

## 2019-09-01 NOTE — ED Notes (Signed)
Faxed to [facility] results along with the IVC paperwork as her status changed from vol to IVC this shift.

## 2019-09-01 NOTE — ED Notes (Signed)
Since the last Ativan injection pt is talkative but not eating or drinking.

## 2019-09-01 NOTE — ED Notes (Signed)
Pt spoke with interpreter. Kept saying that she wants to go home. Refused offer of food and drink. Knows who she is, where she is and thinks that today is April 15. Does not want her Marcina Millard to visit her at this time.  [employer] [phone]

## 2019-09-01 NOTE — Consult Note (Signed)
  Unable to assess patient via tele psych; patient was not responding this morning.  Patient was accepted to [facility] but related to acuity of condition was then declined until she was able to perform her own ADL.  Patient in a catatonic state and was given 2 mg of Ativan IM.   Patient chart reviewed and consulted with Dr. Lucianne Muss  Recommendation:  EKG to rule out QTc prolongation Start Zyprexa 5 mg Bid Po or IM and stop Ativan.  But patient did respond to Ativan so will continue Ativan ECT would help will contact [facility] to look at for possible bed availability.     Disposition: Recommend psychiatric Inpatient admission when medically cleared.   Spoke with Reola Calkins, NP who will review chart with Dr. Toni Amend and feel may be able to accept patient but will need to await Dr. Toni Amend review.

## 2019-09-01 NOTE — ED Notes (Signed)
In bed, alert, talking to herself, continues to cover head with blanket.
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
## 2019-09-01 NOTE — ED Notes (Signed)
Tried to call report x2 with no answer

## 2019-09-01 NOTE — ED Notes (Signed)
Pt silently with eyes closed held her arm up for BP to be taken after this writer lifted her arm and put the cuff on.

## 2019-09-01 NOTE — ED Provider Notes (Signed)
6:20 AM Patient slept overnight after being sedated with Geodon and Ativan.  She awakened and was very agitated, even with attempts to call her using a [place] interpreter.  She was given an additional 10 mg of Geodon IM.  Nursing staff was able to obtain a urine specimen by in and out catheterization.  Urine drug screen and urinalysis are pending at this time.     Solan Vosler, Jonny Ruiz, MD 09/01/19 [phone]

## 2019-09-01 NOTE — ED Notes (Signed)
Spoke with Torrie Mayers RN who said that they cannot take pt until next shift as long as pt is medically cleared. Dr. Rhunette Croft told this writer that she is medically cleared.

## 2019-09-01 NOTE — Discharge Summary (Addendum)
  Patient to be transferred to [facility] for inpatient psychiatric treatment 

## 2019-09-02 DIAGNOSIS — F23 Brief psychotic disorder: Secondary | ICD-10-CM | POA: Diagnosis not present

## 2019-09-02 MED ORDER — OLANZAPINE 10 MG IM SOLR
10.0000 mg | Freq: Once | INTRAMUSCULAR | Status: AC | PRN
  Administered 2019-09-02: 10 mg via INTRAMUSCULAR
  Filled 2019-09-02: qty 10

## 2019-09-02 MED ORDER — OLANZAPINE 5 MG PO TABS
5.0000 mg | ORAL_TABLET | Freq: Two times a day (BID) | ORAL | Status: DC
  Administered 2019-09-03: 5 mg via ORAL
  Filled 2019-09-02: qty 1

## 2019-09-02 MED ORDER — STERILE WATER FOR INJECTION IJ SOLN
INTRAMUSCULAR | Status: AC
  Filled 2019-09-02: qty 10

## 2019-09-02 MED ORDER — TRAZODONE HCL 50 MG PO TABS: 50.0000 mg | ORAL_TABLET | Freq: Every day | ORAL | Status: DC

## 2019-09-02 MED ORDER — OLANZAPINE 10 MG IM SOLR
10.0000 mg | Freq: Once | INTRAMUSCULAR | Status: AC
  Administered 2019-09-02: 10 mg via INTRAMUSCULAR
  Filled 2019-09-02: qty 10

## 2019-09-02 NOTE — ED Notes (Signed)
Pt is refusing to put on pants and since we have visitors had to put her back to bed.  Pt refuses to get up or cover up.  PRN order obtained.

## 2019-09-02 NOTE — ED Provider Notes (Signed)
Emergency Medicine Observation Re-evaluation Note  Kathleen Bailey is a 39 y.o. female, seen on rounds today.  Pt initially presented to the ED for complaints of Bizarre Behavior Currently, the patient is stable  Physical Exam  BP (!) 117/94 (BP Location: Left Arm)   Pulse (!) 139   Temp 97.6 F (36.4 C) (Axillary)   Resp 20   SpO2 97%  Physical Exam  ED Course / MDM  EKG:EKG Interpretation  Date/Time:  Tuesday August 31 2019 16:32:29 EDT Ventricular Rate:  124 PR Interval:  124 QRS Duration: 72 QT Interval:  312 QTC Calculation: 448 R Axis:   89 Text Interpretation: Sinus tachycardia Otherwise normal ECG No acute changes No old tracing to compare Confirmed by Derwood Kaplan [phone]) on 08/31/2019 4:39:34 PM  Clinical Course as of Sep 02 727  Tue Aug 31, 2019  1534 Patient continues to be slightly agitated. Labs are reassuring.  EKG still pending.  Patient is getting combative, we will order IM Geodon.  We need EKG. she had received IM Ativan earlier which had made her somnolent and less combative.   [AN]  1700 All 4 labs are reassuring.  We will get TSH and B12 added.  Dr. Rush Landmark to follow-up.  Patient is tachycardic on EKG.  Unclear reason. I have advised the nursing staff to inform us if patient continues to not eat or drink, as then she will need possibly repeat labs and admission to the hospital.   [AN]    Clinical Course User Index [AN] Derwood Kaplan, MD   I have reviewed the labs performed to date as well as medications administered while in observation.  Recent changes in the last 24 hours include nothing  . Plan  Current plan is for inpatient psych care.    Virgina Norfolk, DO 09/02/19 0730

## 2019-09-02 NOTE — ED Notes (Signed)
Per Recruitment consultant , we can try to fax this patient out.  They will hold her bed in case she clears up by morning.

## 2019-09-02 NOTE — ED Notes (Signed)
Pt is now awake and continues to be psychotic.  She refuses to put on pants and refuses to open her eyes.  She is very bizarre and very difficult to redirect.

## 2019-09-02 NOTE — ED Notes (Signed)
[name] from [employer] refusing to take this patient at this time due to the fact that she was supposed to come last night and since she did not come [place] states that this is a "RED FLAG"

## 2019-09-02 NOTE — Consult Note (Signed)
[facility] Psych ED Progress Note  09/02/2019 12:13 PM Kathleen Bailey  MRN:  [id number] Subjective: Patient assessed by nurse practitioner along with Dr. Lucianne Muss.  Patient currently not participating in assessment.  Patient observed attempting to walk from room into the hallway wearing only a shirt, staff currently attempting to have patient put on clothing or remaining in room. Patient observed pushing and grabbing emergency department staff who are attempting to redirect patient verbally at this time.    Principal Problem: <principal problem not specified> Diagnosis:  Active Problems:   * No active hospital problems. *  Total Time spent with patient: 30 minutes  Past Psychiatric History: Unknown  Past Medical History:  Past Medical History:  Diagnosis Date  . PIH (pregnancy induced hypertension), third trimester 01/23/2018  . Pregnancy induced hypertension   . Status post vacuum-assisted vaginal delivery 01/24/2018   History reviewed. No pertinent surgical history. Family History:  Family History  Problem Relation Age of Onset  . Hyperkalemia Mother   . Stroke Mother   . Hypertension Maternal Grandmother 50   Family Psychiatric  History: Unknown Social History:  Social History   Substance and Sexual Activity  Alcohol Use None     Social History   Substance and Sexual Activity  Drug Use Not on file    Social History   Socioeconomic History  . Marital status: Single    Spouse name: Not on file  . Number of children: Not on file  . Years of education: Not on file  . Highest education level: Not on file  Occupational History  . Not on file  Tobacco Use  . Smoking status: Never Smoker  . Smokeless tobacco: Never Used  Substance and Sexual Activity  . Alcohol use: Not on file  . Drug use: Not on file  . Sexual activity: Not on file  Other Topics Concern  . Not on file  Social History Narrative  . Not on file   Social Determinants of Health   Financial Resource Strain:    . Difficulty of Paying Living Expenses:   Food Insecurity:   . Worried About Programme researcher, broadcasting/film/video in the Last Year:   . Barista in the Last Year:   Transportation Needs:   . Freight forwarder (Medical):   Marland Kitchen Lack of Transportation (Non-Medical):   Physical Activity:   . Days of Exercise per Week:   . Minutes of Exercise per Session:   Stress:   . Feeling of Stress :   Social Connections:   . Frequency of Communication with Friends and Family:   . Frequency of Social Gatherings with Friends and Family:   . Attends Religious Services:   . Active Member of Clubs or Organizations:   . Attends Banker Meetings:   Marland Kitchen Marital Status:     Sleep: Poor  Appetite:  Poor  Current Medications: Current Facility-Administered Medications  Medication Dose Route Frequency Provider Last Rate Last Admin  . acetaminophen (TYLENOL) tablet 650 mg  650 mg Oral Q4H PRN Nanavati, Ankit, MD      . OLANZapine (ZYPREXA) tablet 5 mg  5 mg Oral BID Berneice Heinrich L, FNP      . ondansetron (ZOFRAN) tablet 4 mg  4 mg Oral Q8H PRN Rhunette Croft, Ankit, MD      . sterile water (preservative free) injection           . traZODone (DESYREL) tablet 50 mg  50 mg Oral QHS Berneice Heinrich  L, FNP       No current outpatient medications on file.    Lab Results:  Results for orders placed or performed during the hospital encounter of 08/31/19 (from the past 48 hour(s))  TSH     Status: None   Collection Time: 08/31/19  1:27 PM  Result Value Ref Range   TSH 1.340 0.350 - 4.500 uIU/mL    Comment: Performed by a 3rd Generation assay with a functional sensitivity of <=0.01 uIU/mL. Performed at [facility], [address] [place], [place] [address]   Vitamin B12     Status: Abnormal   Collection Time: 08/31/19  1:27 PM  Result Value Ref Range   Vitamin B-12 922 (H) 180 - 914 pg/mL    Comment: (NOTE) This assay is not validated for testing neonatal or myeloproliferative syndrome  specimens for Vitamin B12 levels. Performed at [facility], [address] [place], [place] [address]   Comprehensive metabolic panel     Status: Abnormal   Collection Time: 08/31/19  1:28 PM  Result Value Ref Range   Sodium 137 135 - 145 mmol/L   Potassium 3.9 3.5 - 5.1 mmol/L   Chloride 104 98 - 111 mmol/L   CO2 22 22 - 32 mmol/L   Glucose, Bld 110 (H) 70 - 99 mg/dL    Comment: Glucose reference range applies only to samples taken after fasting for at least 8 hours.   BUN 12 6 - 20 mg/dL   Creatinine, Ser 2.22 0.44 - 1.00 mg/dL   Calcium 9.3 8.9 - 97.9 mg/dL   Total Protein 9.1 (H) 6.5 - 8.1 g/dL   Albumin 4.5 3.5 - 5.0 g/dL   AST 26 15 - 41 U/L   ALT 25 0 - 44 U/L   Alkaline Phosphatase 82 38 - 126 U/L   Total Bilirubin 0.8 0.3 - 1.2 mg/dL   GFR calc non Af Amer >60 >60 mL/min   GFR calc Af Amer >60 >60 mL/min   Anion gap 11 5 - 15    Comment: Performed at [facility], [address] [place], [place] [address]  Ethanol     Status: None   Collection Time: 08/31/19  1:28 PM  Result Value Ref Range   Alcohol, Ethyl (B) <10 <10 mg/dL    Comment: (NOTE) Lowest detectable limit for serum alcohol is 10 mg/dL. For medical purposes only. Performed at [facility], [address] [place], [place] [address]   CBC with Diff     Status: Abnormal   Collection Time: 08/31/19  1:28 PM  Result Value Ref Range   WBC 13.3 (H) 4.0 - 10.5 K/uL   RBC 5.07 3.87 - 5.11 MIL/uL   Hemoglobin 14.7 12.0 - 15.0 g/dL   HCT 08.1 44.8 - 18.5 %   MCV 89.9 80.0 - 100.0 fL   MCH 29.0 26.0 - 34.0 pg   MCHC 32.2 30.0 - 36.0 g/dL   RDW 63.1 49.7 - 02.6 %   Platelets 195 150 - 400 K/uL   nRBC 0.0 0.0 - 0.2 %   Neutrophils Relative % 80 %   Neutro Abs 10.6 (H) 1.7 - 7.7 K/uL   Lymphocytes Relative 14 %   Lymphs Abs 1.9 0.7 - 4.0 K/uL   Monocytes Relative 5 %   Monocytes Absolute 0.6 0.1 - 1.0 K/uL   Eosinophils Relative 0 %   Eosinophils  Absolute 0.0 0.0 - 0.5 K/uL  Basophils Relative 0 %   Basophils Absolute 0.0 0.0 - 0.1 K/uL   Immature Granulocytes 1 %   Abs Immature Granulocytes 0.08 (H) 0.00 - 0.07 K/uL    Comment: Performed at [facility], [address], [place], [place] [address]  hCG, quantitative, pregnancy     Status: None   Collection Time: 08/31/19  1:28 PM  Result Value Ref Range   hCG, Beta Chain, Quant, S <1 <5 mIU/mL    Comment:          GEST. AGE      CONC.  (mIU/mL)   <=1 WEEK        5 - 50     2 WEEKS       50 - 500     3 WEEKS       100 - 10,000     4 WEEKS     1,000 - 30,000     5 WEEKS     3,500 - 115,000   6-8 WEEKS     12,000 - 270,000    12 WEEKS     15,000 - 220,000        FEMALE AND NON-PREGNANT FEMALE:     LESS THAN 5 mIU/mL Performed at [facility], [address], [place], [place] [address]   Respiratory Panel by RT PCR (Flu A&B, Covid) - Nasopharyngeal Swab     Status: None   Collection Time: 08/31/19  5:37 PM   Specimen: Nasopharyngeal Swab  Result Value Ref Range   SARS Coronavirus 2 by RT PCR NEGATIVE NEGATIVE    Comment: (NOTE) SARS-CoV-2 target nucleic acids are NOT DETECTED. The SARS-CoV-2 RNA is generally detectable in upper respiratoy specimens during the acute phase of infection. The lowest concentration of SARS-CoV-2 viral copies this assay can detect is 131 copies/mL. A negative result does not preclude SARS-Cov-2 infection and should not be used as the sole basis for treatment or other patient management decisions. A negative result may occur with  improper specimen collection/handling, submission of specimen other than nasopharyngeal swab, presence of viral mutation(s) within the areas targeted by this assay, and inadequate number of viral copies (<131 copies/mL). A negative result must be combined with clinical observations, patient history, and epidemiological information. The expected result is Negative. Fact Sheet for  Patients:  https://www.moore.com/ Fact Sheet for Healthcare Providers:  https://www.young.biz/ This test is not yet ap proved or cleared by the [place] FDA and  has been authorized for detection and/or diagnosis of SARS-CoV-2 by FDA under an Emergency Use Authorization (EUA). This EUA will remain  in effect (meaning this test can be used) for the duration of the COVID-19 declaration under Section 564(b)(1) of the Act, 21 U.S.C. section 360bbb-3(b)(1), unless the authorization is terminated or revoked sooner.    Influenza A by PCR NEGATIVE NEGATIVE   Influenza B by PCR NEGATIVE NEGATIVE    Comment: (NOTE) The Xpert Xpress SARS-CoV-2/FLU/RSV assay is intended as an aid in  the diagnosis of influenza from Nasopharyngeal swab specimens and  should not be used as a sole basis for treatment. Nasal washings and  aspirates are unacceptable for Xpert Xpress SARS-CoV-2/FLU/RSV  testing. Fact Sheet for Patients: https://www.moore.com/ Fact Sheet for Healthcare Providers: https://www.young.biz/ This test is not yet approved or cleared by the [place] FDA and  has been authorized for detection and/or diagnosis of SARS-CoV-2 by  FDA under an Emergency Use Authorization (EUA). This EUA will remain  in effect (meaning  this test can be used) for the duration of the  Covid-19 declaration under Section 564(b)(1) of the Act, 21  U.S.C. section 360bbb-3(b)(1), unless the authorization is  terminated or revoked. Performed at [facility], [place] [address], [place], [place] [address]   Urine rapid drug screen (hosp performed)     Status: Abnormal   Collection Time: 09/01/19  6:10 AM  Result Value Ref Range   Opiates NONE DETECTED NONE DETECTED   Cocaine NONE DETECTED NONE DETECTED   Benzodiazepines POSITIVE (A) NONE DETECTED   Amphetamines NONE DETECTED NONE DETECTED   Tetrahydrocannabinol NONE  DETECTED NONE DETECTED   Barbiturates NONE DETECTED NONE DETECTED    Comment: (NOTE) DRUG SCREEN FOR MEDICAL PURPOSES ONLY.  IF CONFIRMATION IS NEEDED FOR ANY PURPOSE, NOTIFY LAB WITHIN 5 DAYS. LOWEST DETECTABLE LIMITS FOR URINE DRUG SCREEN Drug Class                     Cutoff (ng/mL) Amphetamine and metabolites    1000 Barbiturate and metabolites    200 Benzodiazepine                 196 Tricyclics and metabolites     300 Opiates and metabolites        300 Cocaine and metabolites        300 THC                            50 Performed at [facility], [place] [address] [place], [place] [address]   Urinalysis, Routine w reflex microscopic     Status: Abnormal   Collection Time: 09/01/19  6:10 AM  Result Value Ref Range   Color, Urine YELLOW YELLOW   APPearance HAZY (A) CLEAR   Specific Gravity, Urine 1.028 1.005 - 1.030   pH 5.0 5.0 - 8.0   Glucose, UA NEGATIVE NEGATIVE mg/dL   Hgb urine dipstick NEGATIVE NEGATIVE   Bilirubin Urine NEGATIVE NEGATIVE   Ketones, ur 20 (A) NEGATIVE mg/dL   Protein, ur 100 (A) NEGATIVE mg/dL   Nitrite NEGATIVE NEGATIVE   Leukocytes,Ua NEGATIVE NEGATIVE   RBC / HPF 0-5 0 - 5 RBC/hpf   WBC, UA 0-5 0 - 5 WBC/hpf   Bacteria, UA NONE SEEN NONE SEEN   Squamous Epithelial / LPF 0-5 0 - 5   Mucus PRESENT     Comment: Performed at [facility], [place] [address], [place], [place] [address]  Basic metabolic panel     Status: Abnormal   Collection Time: 09/01/19  2:25 PM  Result Value Ref Range   Sodium 143 135 - 145 mmol/L   Potassium 4.2 3.5 - 5.1 mmol/L   Chloride 107 98 - 111 mmol/L   CO2 25 22 - 32 mmol/L   Glucose, Bld 88 70 - 99 mg/dL    Comment: Glucose reference range applies only to samples taken after fasting for at least 8 hours.   BUN 25 (H) 6 - 20 mg/dL   Creatinine, Ser 0.73 0.44 - 1.00 mg/dL   Calcium 9.5 8.9 - 10.3 mg/dL   GFR calc non Af Amer >60 >60 mL/min   GFR calc Af Amer >60 >60 mL/min    Anion gap 11 5 - 15    Comment: Performed at [facility], [place] [address], [place], [place] [address]  Magnesium     Status: Abnormal   Collection Time: 09/01/19  2:25 PM  Result Value Ref Range   Magnesium 2.5 (H) 1.7 - 2.4 mg/dL    Comment: Performed at [facility], [address] [place], [place] [id number]    Blood Alcohol level:  Lab Results  Component Value Date   ETH <10 08/31/2019    Physical Findings: AIMS:  , ,  ,  ,    CIWA:    COWS:     Musculoskeletal: Strength & Muscle Tone: within normal limits Gait & Station: normal Patient leans: N/A  Psychiatric Specialty Exam: Physical Exam Vitals and nursing note reviewed.  Constitutional:      Appearance: She is well-developed.  HENT:     Head: Normocephalic.  Cardiovascular:     Rate and Rhythm: Normal rate.  Pulmonary:     Effort: Pulmonary effort is normal.  Musculoskeletal:        General: Normal range of motion.  Neurological:     Mental Status: She is alert. She is disoriented.  Psychiatric:        Attention and Perception: She is inattentive.        Mood and Affect: Affect is labile and inappropriate.        Speech: She is noncommunicative.        Behavior: Behavior is uncooperative, agitated and combative.        Thought Content: Thought content is paranoid.        Judgment: Judgment is impulsive.     Review of Systems  Psychiatric/Behavioral: Positive for agitation, behavioral problems and dysphoric mood. The patient is nervous/anxious.     Blood pressure (!) 117/94, pulse (!) 139, temperature 97.6 F (36.4 C), temperature source Axillary, resp. rate 20, SpO2 97 %, unknown if currently breastfeeding.There is no height or weight on file to calculate BMI.  General Appearance: Disheveled  Eye Contact:  None  Speech:  Unable to assess  Volume:  Unable to assess  Mood:  Anxious and Dysphoric  Affect:  Non-Congruent and Full Range  Thought Process:  Disorganized  and Descriptions of Associations: Loose  Orientation:  Full (Time, Place, and Person)  Thought Content:  Illogical and Paranoid Ideation  Suicidal Thoughts:  Unable to assess  Homicidal Thoughts:  Unable to assess  Memory:  NA  Judgement:  Impaired  Insight:  Lacking  Psychomotor Activity:  Normal  Concentration:  Concentration: Poor and Attention Span: Poor  Recall:  Unable to assess  Fund of Knowledge:  Unable to assess  Language:  NA  Akathisia:  No  Handed:  Right  AIMS (if indicated):     Assets:  Communication Skills Desire for Improvement Financial Resources/Insurance Housing Intimacy Leisure Time Physical Health Resilience Social Support  ADL's:  Intact  Cognition:  WNL  Sleep:         Treatment Plan Summary: Patient discussed with Dr. Lucianne MussKumar. Medication management discontinue Ativan and Ambien.  Zyprexa 10 mg IM once then Zyprexa 5 mg p.o. twice daily. Patient continues to meet inpatient psychiatric criteria.  Kathleen Dollyina L Tate, FNP 09/02/2019, 12:13 PM

## 2019-09-02 NOTE — BH Assessment (Signed)
BHH Assessment Progress Note  Per Berneice Heinrich, FNP, this pt continues to require psychiatric hospitalization.  Zachery Conch, Nursing Director reports that [facility] continues to hold a bed for her pending pt being stable for transport.  Pt remains under IVC initiated by EDP Lynden Oxford, MD, and IVC documents were faxed to [phone] yesterday, 09/01/2019.  Pt's nurse, Kendal Hymen, has been notified.  Doylene Canning, [place] Behavioral Health Coordinator [phone]

## 2019-09-03 DIAGNOSIS — F23 Brief psychotic disorder: Secondary | ICD-10-CM | POA: Diagnosis not present

## 2019-09-03 MED ORDER — LORAZEPAM 2 MG/ML IJ SOLN
1.0000 mg | Freq: Four times a day (QID) | INTRAMUSCULAR | Status: DC | PRN
  Administered 2019-09-04: 1 mg via INTRAMUSCULAR
  Filled 2019-09-03: qty 1

## 2019-09-03 MED ORDER — STERILE WATER FOR INJECTION IJ SOLN
INTRAMUSCULAR | Status: AC
  Administered 2019-09-03: 10 mL
  Filled 2019-09-03: qty 10

## 2019-09-03 MED ORDER — OLANZAPINE 5 MG PO TABS: 5.0000 mg | ORAL_TABLET | Freq: Two times a day (BID) | ORAL | Status: DC

## 2019-09-03 MED ORDER — LORAZEPAM 1 MG PO TABS: 1.0000 mg | ORAL_TABLET | Freq: Four times a day (QID) | ORAL | Status: DC | PRN

## 2019-09-03 MED ORDER — OLANZAPINE 10 MG IM SOLR
5.0000 mg | Freq: Two times a day (BID) | INTRAMUSCULAR | Status: DC
  Administered 2019-09-03 (×2): 5 mg via INTRAMUSCULAR
  Filled 2019-09-03 (×2): qty 10

## 2019-09-03 MED ORDER — STERILE WATER FOR INJECTION IJ SOLN
INTRAMUSCULAR | Status: AC
  Administered 2019-09-03: 22:00:00 2.1 mL
  Filled 2019-09-03: qty 10

## 2019-09-03 NOTE — ED Notes (Signed)
Attempted to speak with patient. Patient refusing to answer questions, eat, or drink. Patient lying in bed with eyes closed and resting.

## 2019-09-03 NOTE — ED Notes (Addendum)
Pt refused to eat or drink anything so far today. Pt will not respond when spoken to and continues to keep eyes closed.

## 2019-09-03 NOTE — ED Notes (Addendum)
Attempted to speak to patient using translator. Patient not answering questions and will not answer to take medications.

## 2019-09-03 NOTE — Progress Notes (Signed)
Received Kathleen Bailey at the change of shift asleep in bed with her mother at the bedside with the sitter. The translator machine was used to communicate with mom who was tearful and felt helpless. She was given emotional support. The patient continued to sleep throughout the evening and throughout the night.

## 2019-09-03 NOTE — ED Notes (Signed)
Pt not responding to staff , answering questions or following instructions. Pt continues not to eat or drink this shift.

## 2019-09-03 NOTE — ED Notes (Addendum)
Per Lurena Joiner, patient accepted at [facility]. Nursing station phone number [phone]

## 2019-09-03 NOTE — ED Notes (Signed)
Pt not responding or answering questions. Pt refusing to eat or drink. Pt sleeping , eyes closed

## 2019-09-03 NOTE — ED Notes (Signed)
Pt will not respond or answer questions. Pt does move around at times. Pt not eating or drinking.

## 2019-09-03 NOTE — ED Notes (Signed)
Water was offered, pt refused. Pt not answering or responding to staff.

## 2019-09-03 NOTE — ED Notes (Signed)
Patient to be transported in the morning

## 2019-09-03 NOTE — ED Notes (Signed)
Dinner was offered, pt refused. Eyes closed, pt moving in bed but not responding to staff.

## 2019-09-03 NOTE — ED Provider Notes (Signed)
Emergency Medicine Observation Re-evaluation Note  Kathleen Bailey is a 39 y.o. female, seen on rounds today.  Pt initially presented to the ED for complaints of Bizarre Behavior Currently, the patient is resting comfortably and awaiting placement.  Physical Exam  BP 109/75 (BP Location: Left Arm)   Pulse (!) 116   Temp (!) 97.3 F (36.3 C) (Axillary)   Resp 18   SpO2 100%  Physical Exam Comfortable. No acute distress.   ED Course / MDM  EKG:EKG Interpretation  Date/Time:  Tuesday August 31 2019 16:32:29 EDT Ventricular Rate:  124 PR Interval:  124 QRS Duration: 72 QT Interval:  312 QTC Calculation: 448 R Axis:   89 Text Interpretation: Sinus tachycardia Otherwise normal ECG No acute changes No old tracing to compare Confirmed by Derwood Kaplan [phone]) on 08/31/2019 4:39:34 PM  Clinical Course as of Sep 03 1022  Tue Aug 31, 2019  1534 Patient continues to be slightly agitated. Labs are reassuring.  EKG still pending.  Patient is getting combative, we will order IM Geodon.  We need EKG. she had received IM Ativan earlier which had made her somnolent and less combative.   [AN]  1700 All 4 labs are reassuring.  We will get TSH and B12 added.  Dr. Rush Landmark to follow-up.  Patient is tachycardic on EKG.  Unclear reason. I have advised the nursing staff to inform us if patient continues to not eat or drink, as then she will need possibly repeat labs and admission to the hospital.   [AN]    Clinical Course User Index [AN] Derwood Kaplan, MD   I have reviewed the labs performed to date as well as medications administered while in observation.  Recent changes in the last 24 hours include N/A. Plan  Current plan is for placement.    Maia Plan, MD 09/03/19 1025

## 2019-09-03 NOTE — ED Notes (Signed)
Pt eyes closed, sleeping. Water was offered, pt refused.

## 2019-09-03 NOTE — BH Assessment (Signed)
BHH Assessment Progress Note  Per Berneice Heinrich, FNP, this pt continues to require psychiatric hospitalization at this time.  Pt was previously accepted to both [facility] and to [facility], but both facilities later indicated that they were not able to receive her at the time that nurse-to-nurse report was called.  She remains under IVC initiated by EDP Lynden Oxford, MD.  The following facilities have been contacted to seek placement for this pt, with results as noted:  Beds available, information sent, decision pending: [facility] (re-referral) [facility]   At capacity: [facility]   Doylene Canning, [place] Behavioral Health Coordinator [phone]

## 2019-09-03 NOTE — Progress Notes (Signed)
Patient meets inpatient criteria per Shuvon Rankin, NP. Patient has been faxed out to the following facilities for review:   [facility]  [facility]  [facility]  [facility]  [facility]  [facility]  CSW will continue to follow and assist with disposition planning.   Gera Inboden, MSW, LCSW-A Clinical Disposition Social Worker  Health/TTS [phone]  

## 2019-09-04 DIAGNOSIS — F23 Brief psychotic disorder: Secondary | ICD-10-CM | POA: Diagnosis not present

## 2019-09-04 NOTE — Consult Note (Signed)
Received notification in [facility] report that patient has been accepted to [facility].  EMTALA completed in preparation for transport by [facility].

## 2019-09-04 NOTE — ED Notes (Signed)
Surveyor, mining are scheduled to be here at 9:30 for transport to [facility].

## 2019-09-04 NOTE — ED Notes (Addendum)
Pt urinated in the cup and attempted to drink it she was stop by the nurse tech.

## 2019-09-04 NOTE — Progress Notes (Signed)
Sheriff called and can be here at 0930. Called [facility] and they can be here at 0930 to transport to [facility].

## 2019-09-04 NOTE — ED Notes (Signed)
Attempted to set up transfer for patient, but unable to at this time

## 2019-09-04 NOTE — ED Notes (Addendum)
Patient noted to be displaying catatonia-like behavior. Patient kneeled down on the floor by the door for over an hour without movement. Patient then stood up, noting to be unsteady on her feet. Patient then witnessed standing in the middle of her room, eyes closed, no movement for around 10 minutes. Patient then began attempting to walk around the room and would not open her eyes. Staff attempted to help patient back to bed, but patient began to strike out. Patient medicated for catatonia symptoms. Patient placed into bed and covered up. Patient has water next to her bed. Patient still refusing to answer questions, barely follows commands and is still not eating or drinking.

## 2019-09-04 NOTE — ED Notes (Signed)
Per report , Nursing report was given during the night.  Pt has been transported per [employer] with [employer].  Pt was calm at transfer.  All belongings were sent with patient.

## 2019-09-04 NOTE — ED Notes (Signed)
Patient noted to be displaying some bizarre and paranoid behavior. Patient did not seem to trust water in cup, so provided her with a bottle of water. Patient rubbing the bottle between her hands. Patient provided with warm water but is refusing to drink now. Patient sitting on the side of the bed calm

## 2019-09-04 NOTE — ED Notes (Signed)
Patient woke up and is ambulatory to restroom

## 2019-09-08 ENCOUNTER — Ambulatory Visit: Payer: BLUE CROSS/BLUE SHIELD

## 2019-12-09 ENCOUNTER — Ambulatory Visit: Payer: Medicaid Other | Attending: Internal Medicine

## 2019-12-09 DIAGNOSIS — Z23 Encounter for immunization: Secondary | ICD-10-CM

## 2019-12-09 NOTE — Progress Notes (Signed)
   Covid-19 Vaccination Clinic  Name:  Kathleen Bailey    MRN: [id number] DOB: July 24, 1980  12/09/2019  Kathleen Bailey was observed post Covid-19 immunization for 15 minutes without incident. She was provided with Vaccine Information Sheet and instruction to access the V-Safe system.   Kathleen Bailey was instructed to call 911 with any severe reactions post vaccine: Marland Kitchen Difficulty breathing  . Swelling of face and throat  . A fast heartbeat  . A bad rash all over body  . Dizziness and weakness   Immunizations Administered    Name Date Dose VIS Date Route   Pfizer COVID-19 Vaccine 12/09/2019  4:08 PM 0.3 mL 07/14/2018 Intramuscular   Manufacturer: [employer], [employer]   Lot: [id number]   NDC: [address]-0

## 2021-12-27 IMAGING — MR MR HEAD WO/W CM
14 series · 48 of 48 positions shown · IV contrast (gadavist)
Comparison: None.

CLINICAL DATA: Altered mental status

EXAM:
MRI HEAD WITHOUT AND WITH CONTRAST
TECHNIQUE: Multiplanar, multiecho pulse sequences of the brain and surrounding
structures were obtained without and with intravenous contrast.
CONTRAST:  8 mL Gadavist

[Series 5: T1 · sagittal · 5.0mm · 0.75mm/px · 3 of 24 slices shown (1 of 2)]
[im 1/24]
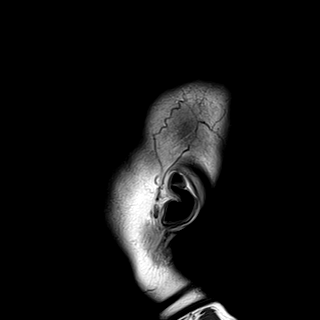
[im 12/24]
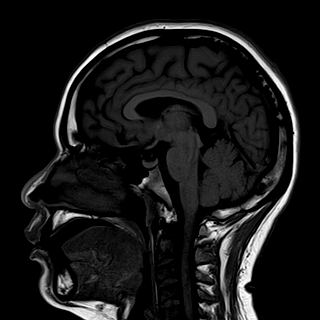
[im 24/24]
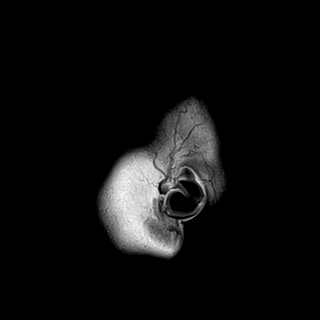

[Series 6: T2 · axial · 5.0mm · 0.62mm/px · z∈[+7,+164]mm · 3 of 26 slices shown]
[im 1/26]
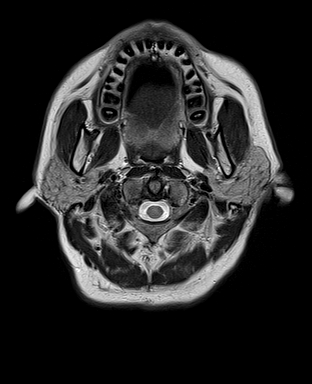
[im 13/26]
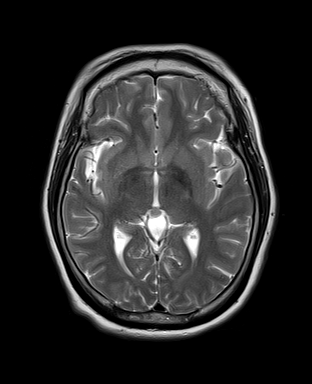
[im 26/26]
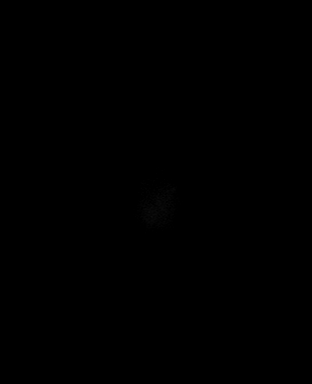

[Series 7: DWI · axial · 3.0mm · 1.36mm/px · z∈[+23,+159]mm · 6 of 96 slices shown (1 of 4)]
[im 1/96]
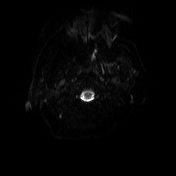
[im 20/96]
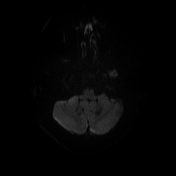
[im 39/96]
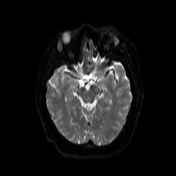
[im 58/96]
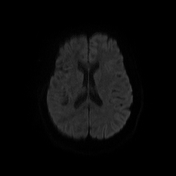
[im 77/96]
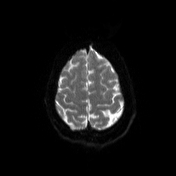
[im 96/96]
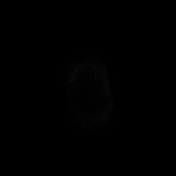

[Series 8: DWI · axial · 3.0mm · 1.36mm/px · z∈[+23,+159]mm · 3 of 48 slices shown (2 of 4)]
[im 1/48]
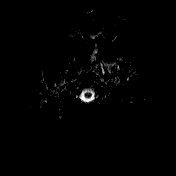
[im 24/48]
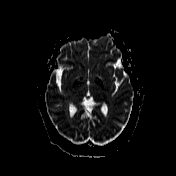
[im 48/48]
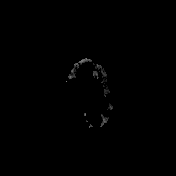

[Series 9: mip_images(sw) · axial · 24.0mm · 0.75mm/px · z∈[+22,+149]mm · 3 of 45 slices shown]
[im 1/45]
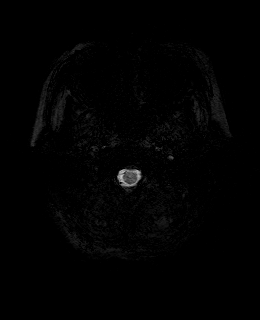
[im 23/45]
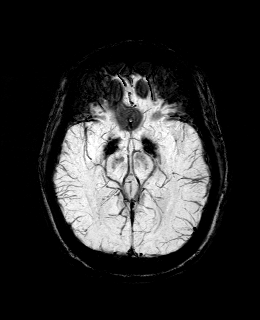
[im 45/45]
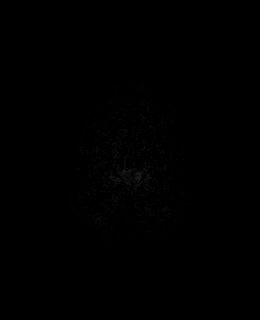

[Series 10: swi_images · axial · 3.0mm · 0.75mm/px · z∈[+11,+159]mm · 3 of 52 slices shown]
[im 1/52]
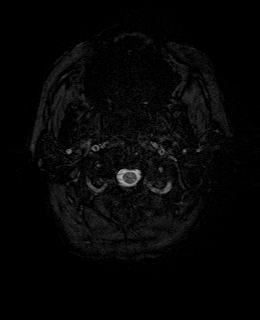
[im 26/52]
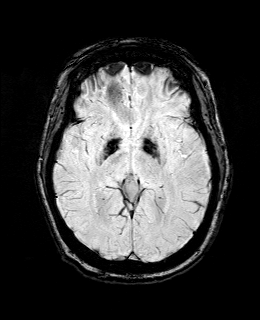
[im 52/52]
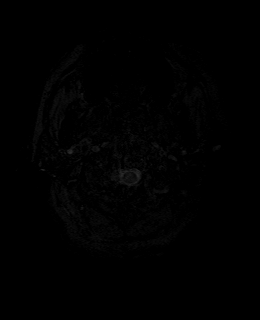

[Series 11: FLAIR · axial · 3.0mm · 0.75mm/px · z∈[+11,+159]mm · 3 of 52 slices shown]
[im 1/52]
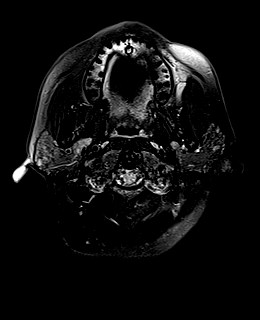
[im 26/52]
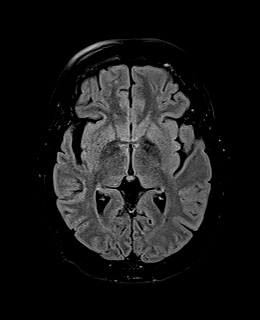
[im 52/52]
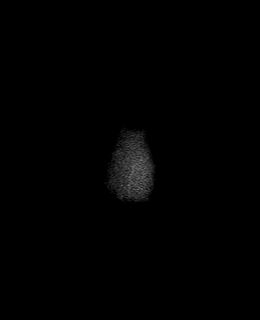

[Series 12: T1 · axial · 1.0mm · 0.94mm/px · z∈[+16,+154]mm · 9 of 144 slices shown (2 of 2)]
[im 1/144]
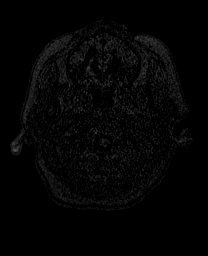
[im 18/144]
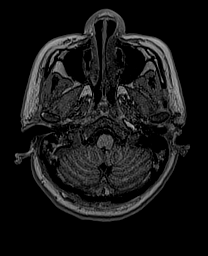
[im 36/144]
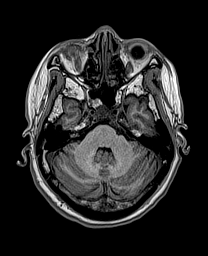
[im 54/144]
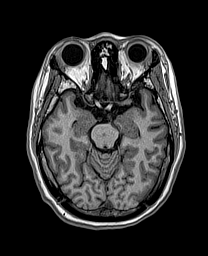
[im 72/144]
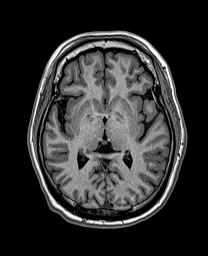
[im 90/144]
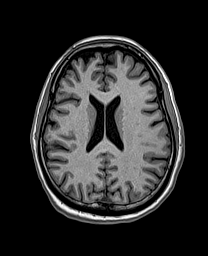
[im 108/144]
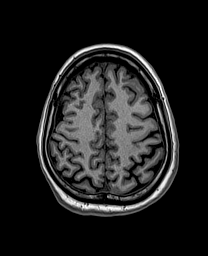
[im 126/144]
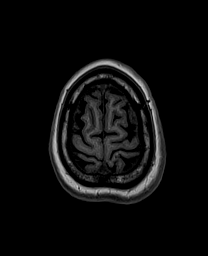
[im 144/144]
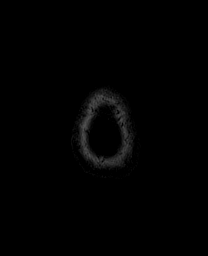

[Series 13: DWI · coronal · 5.0mm · 1.31mm/px · 4 of 64 slices shown (3 of 4)]
[im 1/64]
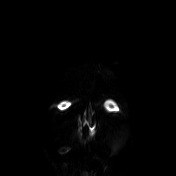
[im 22/64]
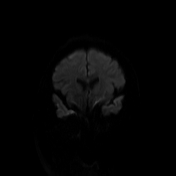
[im 43/64]
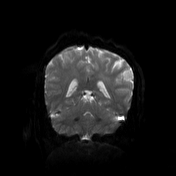
[im 64/64]
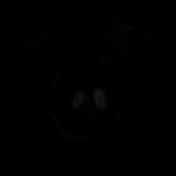

[Series 14: DWI · coronal · 5.0mm · 1.31mm/px · 2 of 32 slices shown (4 of 4)]
[im 1/32]
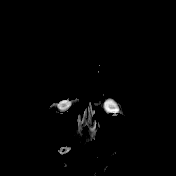
[im 32/32]
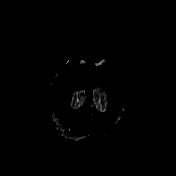

[Series 15: T2 post-contrast · coronal · 5.0mm · 0.86mm/px · 2 of 32 slices shown (1 of 2)]
[im 1/32]
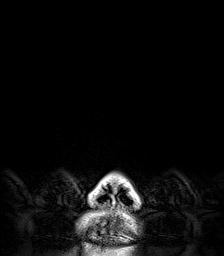
[im 32/32]
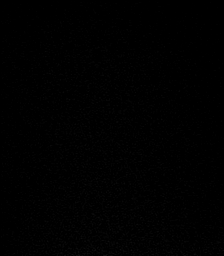

[Series 20: T1 post-contrast · axial · 3.0mm · 0.45mm/px · z∈[-53,+97]mm · 3 of 52 slices shown (1 of 2)]
[im 1/52]
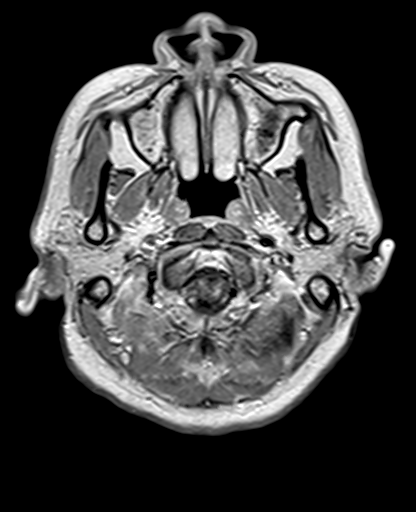
[im 26/52]
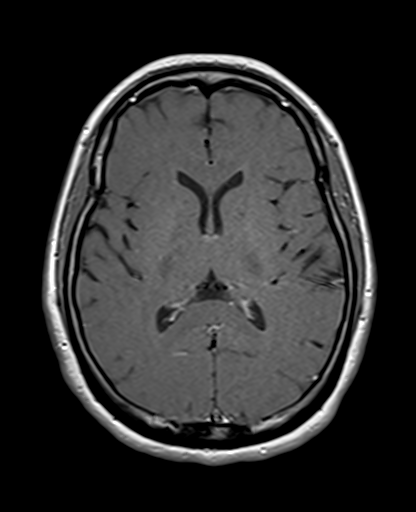
[im 52/52]
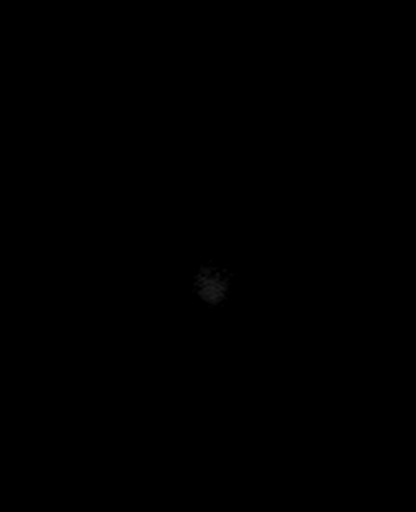

[Series 21: T1 post-contrast · coronal · 5.0mm · 0.43mm/px · 2 of 32 slices shown (2 of 2)]
[im 1/32]
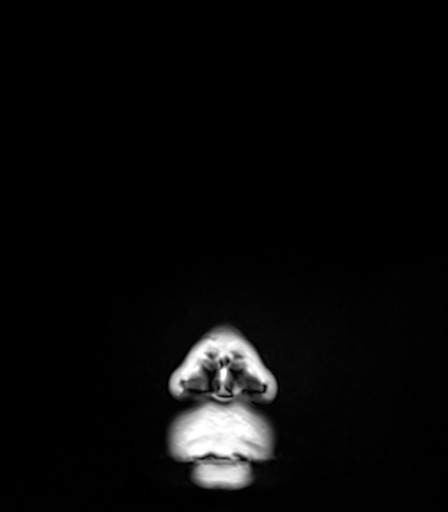
[im 32/32]
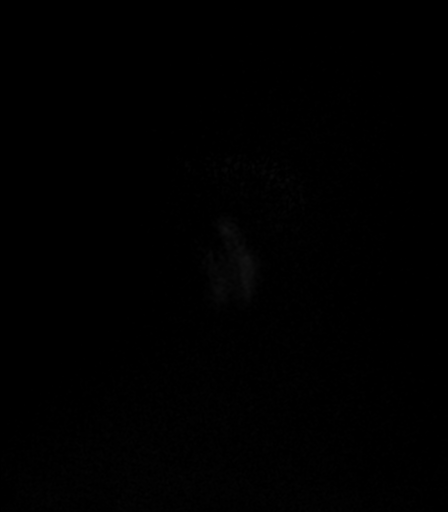

[Series 22: T2 post-contrast · coronal · 5.0mm · 0.86mm/px · 2 of 32 slices shown (2 of 2)]
[im 1/32]
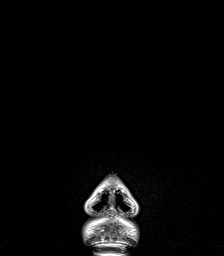
[im 32/32]
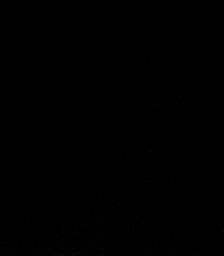

[48 of 48 positions shown; findings below may reference images not displayed]

FINDINGS: BRAIN: No acute infarct, acute hemorrhage or extra-axial collection.
Normal white matter signal for age. Normal volume of brain
parenchyma and CSF spaces. Midline structures are normal.

VASCULAR: Major flow voids are preserved. Susceptibility-sensitive
sequences show no chronic microhemorrhage or superficial siderosis.

SKULL AND UPPER CERVICAL SPINE: Normal calvarium and skull base.
Visualized upper cervical spine and soft tissues are normal.

SINUSES/ORBITS: No paranasal sinus fluid levels or advanced mucosal
thickening. No mastoid or middle ear effusion. Normal orbits.
IMPRESSION: Normal brain MRI.

## 2021-12-27 IMAGING — DX DG ORBITS COMPLETE 4+V
2 series · 2 of 2 positions shown · non-contrast
Comparison: None.

CLINICAL DATA: Pre MRI clearance

EXAM:
ORBITS - COMPLETE 4+ VIEW

[orbital axial (1 of 2)]
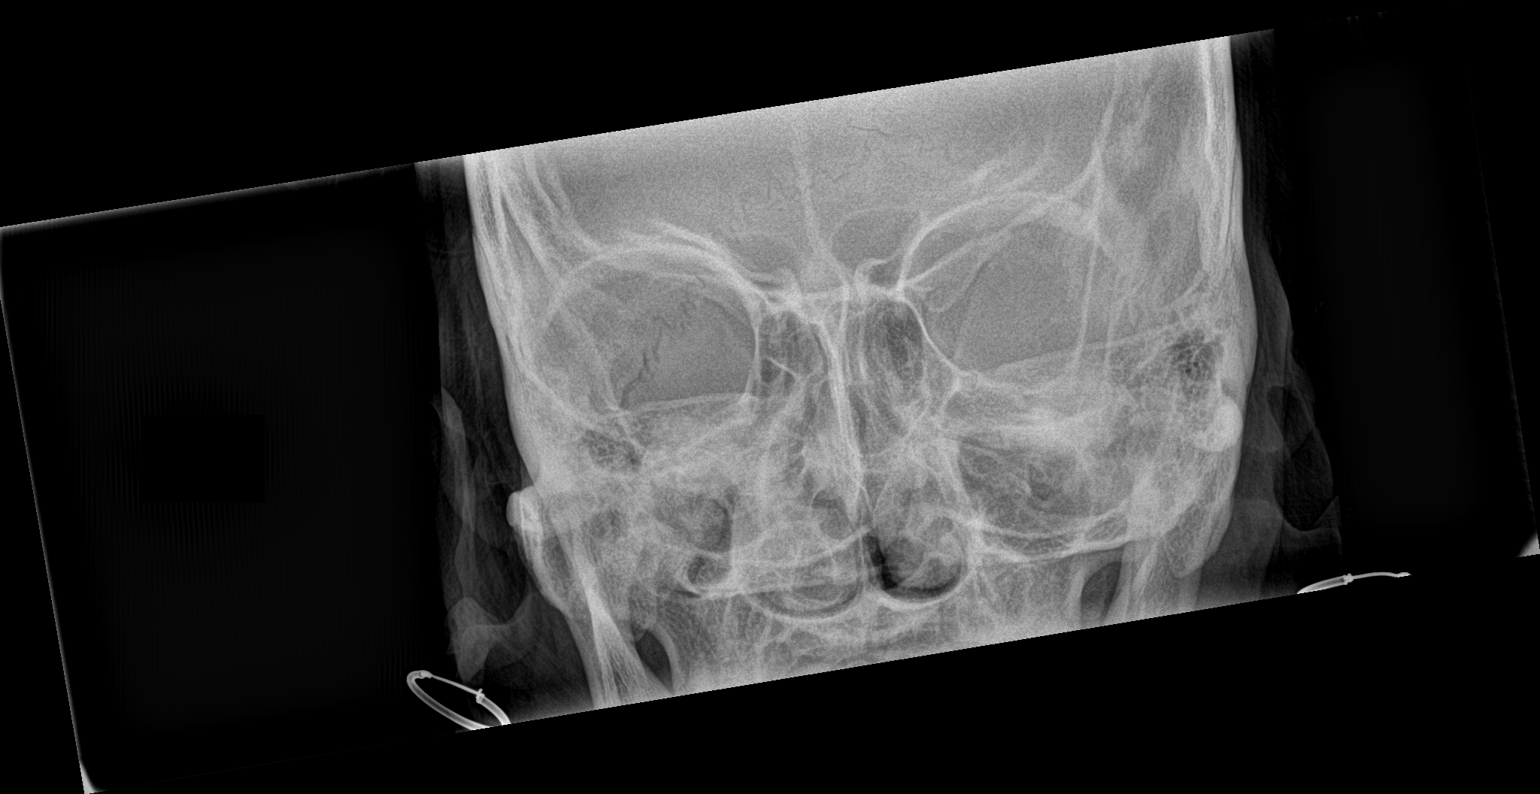

[orbital axial (2 of 2)]
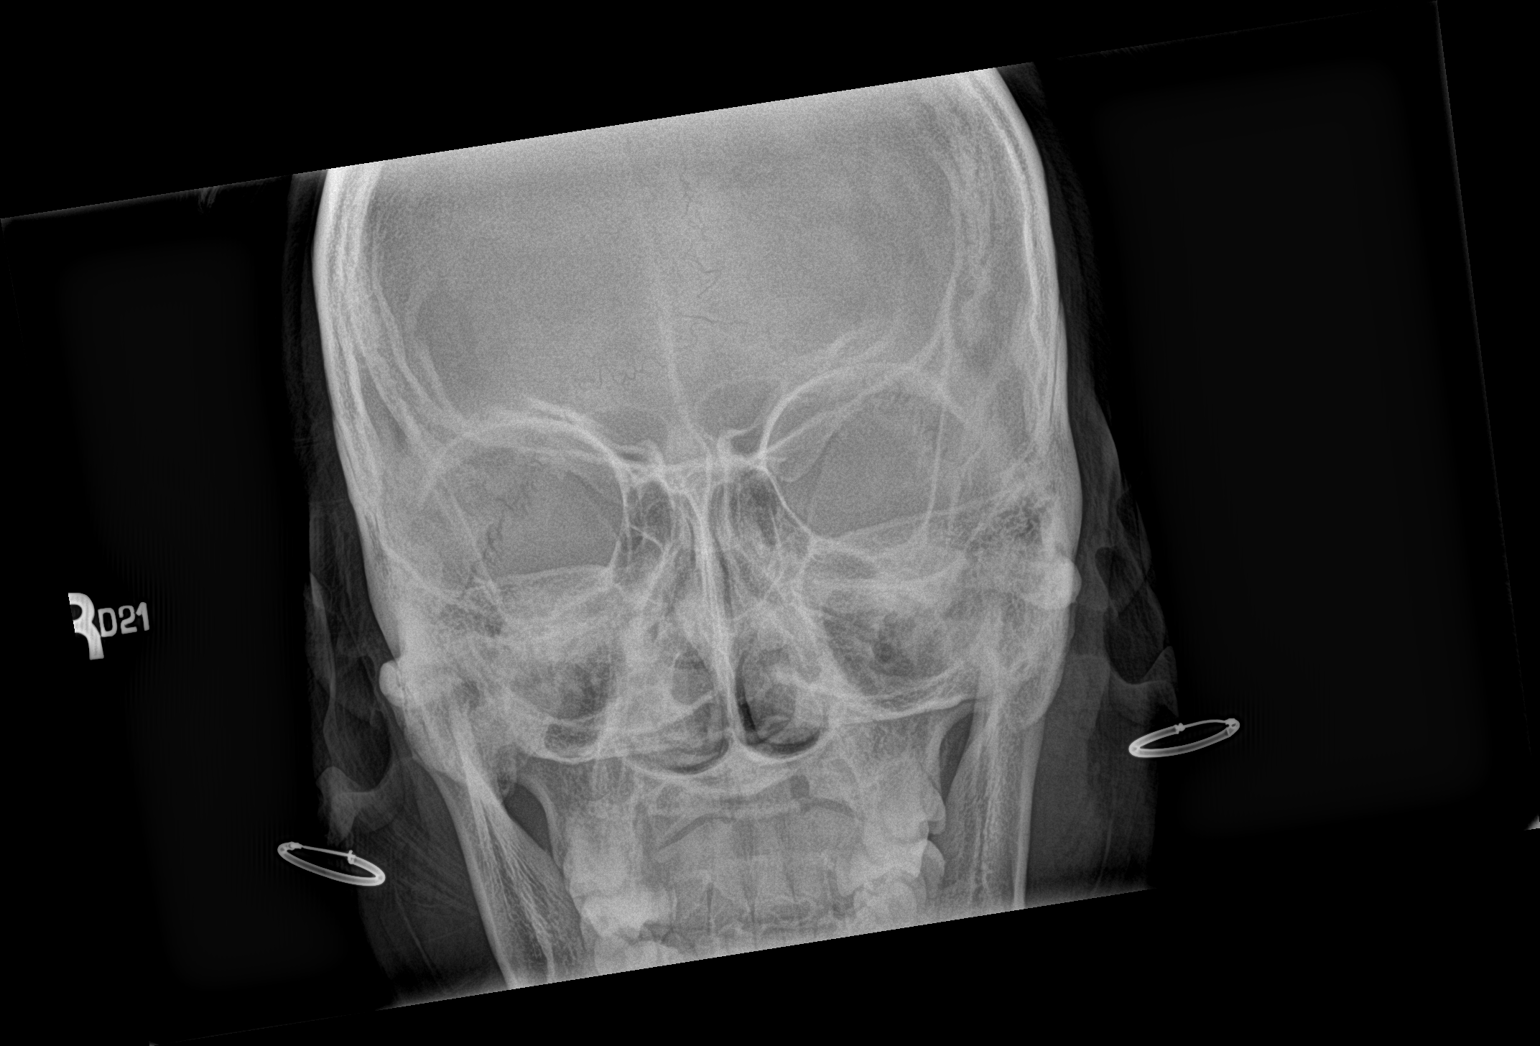

[2 of 2 positions shown; findings below may reference images not displayed]

FINDINGS: There is no evidence of metallic foreign body within the orbits. No
significant bone abnormality identified.
IMPRESSION: No evidence of metallic foreign body within the orbits.

## 2024-04-06 ENCOUNTER — Other Ambulatory Visit: Payer: Self-pay | Admitting: Internal Medicine

## 2024-04-06 DIAGNOSIS — Z1231 Encounter for screening mammogram for malignant neoplasm of breast: Secondary | ICD-10-CM
